# Patient Record
Sex: Male | Born: 1950 | Race: White | Hispanic: No | Marital: Married | Smoking: Current every day smoker
Health system: Southern US, Community
[De-identification: ages and names within clinical notes are randomized; demographics above are authoritative.]

## PROBLEM LIST (undated history)

## (undated) DIAGNOSIS — R519 Headache, unspecified: Secondary | ICD-10-CM

## (undated) DIAGNOSIS — R51 Headache: Secondary | ICD-10-CM

## (undated) HISTORY — PX: FINGER SURGERY: SHX640

## (undated) HISTORY — PX: APPENDECTOMY: SHX54

---

## 2004-11-23 ENCOUNTER — Emergency Department: Payer: Self-pay | Admitting: Emergency Medicine

## 2004-11-23 ENCOUNTER — Other Ambulatory Visit: Payer: Self-pay

## 2004-11-25 ENCOUNTER — Ambulatory Visit: Payer: Self-pay | Admitting: Emergency Medicine

## 2006-01-20 ENCOUNTER — Ambulatory Visit: Payer: Self-pay | Admitting: Family Medicine

## 2006-12-13 ENCOUNTER — Emergency Department: Payer: Self-pay | Admitting: Internal Medicine

## 2007-11-11 ENCOUNTER — Emergency Department: Payer: Self-pay | Admitting: Emergency Medicine

## 2007-11-11 ENCOUNTER — Other Ambulatory Visit: Payer: Self-pay

## 2008-10-01 ENCOUNTER — Emergency Department: Payer: Self-pay | Admitting: Emergency Medicine

## 2015-11-22 ENCOUNTER — Encounter
Admission: RE | Admit: 2015-11-22 | Discharge: 2015-11-22 | Disposition: A | Discharge status: Home / Self Care | Payer: Medicare Other | Source: Ambulatory Visit | Attending: Surgery | Admitting: Surgery

## 2015-11-22 DIAGNOSIS — Z01810 Encounter for preprocedural cardiovascular examination: Secondary | ICD-10-CM | POA: Diagnosis not present

## 2015-11-22 HISTORY — DX: Headache: R51

## 2015-11-22 HISTORY — DX: Headache, unspecified: R51.9

## 2015-11-22 NOTE — Patient Instructions (Signed)
  Your procedure is scheduled on: November 28, 2015 (Thursday) Report to Same Day Surgery 2nd floor [facility] To find out your arrival time please call [phone] between 1PM - 3PM on November 27, 2015 (Wednesday)  Remember: Instructions that are not followed completely may result in serious medical risk, up to and including death, or upon the discretion of your surgeon and anesthesiologist your surgery may need to be rescheduled.    _x___ 1. Do not eat food or drink liquids after midnight. No gum chewing or hard candies.     _x__ 2. No Alcohol for 24 hours before or after surgery.   _x___3. No Smoking for 24 prior to surgery.   ____  4. Bring all medications with you on the day of surgery if instructed.    __x__ 5. Notify your doctor if there is any change in your medical condition     (cold, fever, infections).     Do not wear jewelry, make-up, hairpins, clips or nail polish.  Do not wear lotions, powders, or perfumes. You may wear deodorant.  Do not shave 48 hours prior to surgery. Men may shave face and neck.  Do not bring valuables to the hospital.    [facility] is not responsible for any belongings or valuables.               Contacts, dentures or bridgework may not be worn into surgery.  Leave your suitcase in the car. After surgery it may be brought to your room.  For patients admitted to the hospital, discharge time is determined by your treatment team.   Patients discharged the day of surgery will not be allowed to drive home.    Please read over the following fact sheets that you were given:   [facility] Preparing for [facility] and or MRSA Information   _x___ Take these medicines the morning of surgery with A SIP OF WATER:    1.   2.  3.  4.  5.  6.  ____ Fleet Enema (as directed)   _x___ Use CHG Soap or sage wipes as directed on instruction sheet   ____ Use inhalers on the day of surgery and bring to hospital day of surgery  ____ Stop metformin 2 days  prior to surgery    ____ Take 1/2 of usual insulin dose the night before surgery and none on the morning of  surgery          _x___ Stop aspirin or coumadin, or plavix (NO ASPIRIN)  _x__ Stop Anti-inflammatories such as Advil, Aleve, Ibuprofen, Motrin, Naproxen,          Naprosyn, Goodies powders or aspirin products. Ok to take Tylenol.   ____ Stop supplements until after surgery.    ____ Bring C-Pap to the hospital.

## 2015-11-28 ENCOUNTER — Ambulatory Visit
Admission: RE | Admit: 2015-11-28 | Discharge: 2015-11-28 | Disposition: A | Discharge status: Home / Self Care | Payer: Medicare Other | Source: Ambulatory Visit | Attending: Surgery | Admitting: Surgery

## 2015-11-28 ENCOUNTER — Encounter
Admission: RE | Disposition: A | Discharge status: Home / Self Care | Payer: Self-pay | Source: Ambulatory Visit | Attending: Surgery

## 2015-11-28 ENCOUNTER — Encounter: Payer: Self-pay | Admitting: *Deleted

## 2015-11-28 ENCOUNTER — Ambulatory Visit: Payer: Medicare Other | Admitting: *Deleted

## 2015-11-28 DIAGNOSIS — R51 Headache: Secondary | ICD-10-CM | POA: Insufficient documentation

## 2015-11-28 DIAGNOSIS — Z833 Family history of diabetes mellitus: Secondary | ICD-10-CM | POA: Diagnosis not present

## 2015-11-28 DIAGNOSIS — F17210 Nicotine dependence, cigarettes, uncomplicated: Secondary | ICD-10-CM | POA: Diagnosis not present

## 2015-11-28 DIAGNOSIS — Z9852 Vasectomy status: Secondary | ICD-10-CM | POA: Diagnosis not present

## 2015-11-28 DIAGNOSIS — Z808 Family history of malignant neoplasm of other organs or systems: Secondary | ICD-10-CM | POA: Insufficient documentation

## 2015-11-28 DIAGNOSIS — Z885 Allergy status to narcotic agent status: Secondary | ICD-10-CM | POA: Insufficient documentation

## 2015-11-28 DIAGNOSIS — Z803 Family history of malignant neoplasm of breast: Secondary | ICD-10-CM | POA: Diagnosis not present

## 2015-11-28 DIAGNOSIS — K429 Umbilical hernia without obstruction or gangrene: Secondary | ICD-10-CM | POA: Diagnosis present

## 2015-11-28 HISTORY — PX: UMBILICAL HERNIA REPAIR: SHX196

## 2015-11-28 SURGERY — REPAIR, HERNIA, UMBILICAL, ADULT
Anesthesia: General | Wound class: Clean

## 2015-11-28 MED ORDER — LACTATED RINGERS IV SOLN
INTRAVENOUS | Status: DC
  Administered 2015-11-28 (×2): via INTRAVENOUS

## 2015-11-28 MED ORDER — FENTANYL CITRATE (PF) 100 MCG/2ML IJ SOLN
INTRAMUSCULAR | Status: DC | PRN
  Administered 2015-11-28: 100 ug via INTRAVENOUS

## 2015-11-28 MED ORDER — MIDAZOLAM HCL 2 MG/2ML IJ SOLN
INTRAMUSCULAR | Status: DC | PRN
Start: 2015-11-28 — End: 2015-11-28
  Administered 2015-11-28: 2 mg via INTRAVENOUS

## 2015-11-28 MED ORDER — HYDROCODONE-ACETAMINOPHEN 5-325 MG PO TABS: 1.0000 | ORAL_TABLET | ORAL | Status: DC | PRN

## 2015-11-28 MED ORDER — FAMOTIDINE 20 MG PO TABS
20.0000 mg | ORAL_TABLET | Freq: Once | ORAL | Status: AC
  Administered 2015-11-28: 20 mg via ORAL

## 2015-11-28 MED ORDER — ROCURONIUM BROMIDE 100 MG/10ML IV SOLN
INTRAVENOUS | Status: DC | PRN
  Administered 2015-11-28: 5 mg via INTRAVENOUS
  Administered 2015-11-28: 20 mg via INTRAVENOUS

## 2015-11-28 MED ORDER — HYDROCODONE-ACETAMINOPHEN 5-325 MG PO TABS: 1.0000 | ORAL_TABLET | ORAL | 0 refills | Status: DC | PRN

## 2015-11-28 MED ORDER — FENTANYL CITRATE (PF) 100 MCG/2ML IJ SOLN
INTRAMUSCULAR | Status: AC
  Filled 2015-11-28: qty 2

## 2015-11-28 MED ORDER — SUCCINYLCHOLINE CHLORIDE 20 MG/ML IJ SOLN
INTRAMUSCULAR | Status: DC | PRN
  Administered 2015-11-28: 140 mg via INTRAVENOUS

## 2015-11-28 MED ORDER — CEFAZOLIN SODIUM-DEXTROSE 2-4 GM/100ML-% IV SOLN
INTRAVENOUS | Status: AC
  Administered 2015-11-28: 2 g via INTRAVENOUS
  Filled 2015-11-28: qty 100

## 2015-11-28 MED ORDER — ONDANSETRON HCL 4 MG/2ML IJ SOLN
INTRAMUSCULAR | Status: DC | PRN
  Administered 2015-11-28: 4 mg via INTRAVENOUS

## 2015-11-28 MED ORDER — CEFAZOLIN SODIUM-DEXTROSE 2-4 GM/100ML-% IV SOLN
2.0000 g | Freq: Once | INTRAVENOUS | Status: AC
Start: 2015-11-28 — End: 2015-11-28
  Administered 2015-11-28: 2 g via INTRAVENOUS

## 2015-11-28 MED ORDER — PROPOFOL 10 MG/ML IV BOLUS
INTRAVENOUS | Status: DC | PRN
  Administered 2015-11-28: 200 mg via INTRAVENOUS

## 2015-11-28 MED ORDER — LABETALOL HCL 5 MG/ML IV SOLN
INTRAVENOUS | Status: DC | PRN
  Administered 2015-11-28 (×2): 10 mg via INTRAVENOUS

## 2015-11-28 MED ORDER — IPRATROPIUM-ALBUTEROL 0.5-2.5 (3) MG/3ML IN SOLN
RESPIRATORY_TRACT | Status: AC
  Administered 2015-11-28: 3 mL
  Filled 2015-11-28: qty 3

## 2015-11-28 MED ORDER — IPRATROPIUM-ALBUTEROL 0.5-2.5 (3) MG/3ML IN SOLN
3.0000 mL | Freq: Once | RESPIRATORY_TRACT | Status: AC
  Administered 2015-11-28: 3 mL via RESPIRATORY_TRACT

## 2015-11-28 MED ORDER — FAMOTIDINE 20 MG PO TABS
ORAL_TABLET | ORAL | Status: AC
  Administered 2015-11-28: 20 mg via ORAL
  Filled 2015-11-28: qty 1

## 2015-11-28 MED ORDER — SUGAMMADEX SODIUM 200 MG/2ML IV SOLN
INTRAVENOUS | Status: DC | PRN
  Administered 2015-11-28: 190.6 mg via INTRAVENOUS

## 2015-11-28 MED ORDER — BUPIVACAINE-EPINEPHRINE 0.5% -1:200000 IJ SOLN
INTRAMUSCULAR | Status: DC | PRN
  Administered 2015-11-28: 7 mL

## 2015-11-28 MED ORDER — BUPIVACAINE-EPINEPHRINE (PF) 0.5% -1:200000 IJ SOLN
INTRAMUSCULAR | Status: AC
  Filled 2015-11-28: qty 30

## 2015-11-28 SURGICAL SUPPLY — 25 items
BLADE CLIPPER SURG (BLADE) ×3 IMPLANT
BLADE SURG 15 STRL LF DISP TIS (BLADE) ×1 IMPLANT
BLADE SURG 15 STRL SS (BLADE) ×2
CANISTER SUCT 1200ML W/VALVE (MISCELLANEOUS) ×3 IMPLANT
CHLORAPREP W/TINT 26ML (MISCELLANEOUS) ×3 IMPLANT
DRAPE LAPAROTOMY 77X122 PED (DRAPES) ×3 IMPLANT
ELECT REM PT RETURN 9FT ADLT (ELECTROSURGICAL) ×3
ELECTRODE REM PT RTRN 9FT ADLT (ELECTROSURGICAL) ×1 IMPLANT
GLOVE BIO SURGEON STRL SZ7.5 (GLOVE) ×12 IMPLANT
GOWN STRL REUS W/ TWL LRG LVL3 (GOWN DISPOSABLE) ×3 IMPLANT
GOWN STRL REUS W/TWL LRG LVL3 (GOWN DISPOSABLE) ×6
KIT RM TURNOVER STRD PROC AR (KITS) ×3 IMPLANT
LABEL OR SOLS (LABEL) ×3 IMPLANT
LIQUID BAND (GAUZE/BANDAGES/DRESSINGS) ×6 IMPLANT
MESH SYNTHETIC 4X6 SOFT BARD (Mesh General) ×1 IMPLANT
MESH SYNTHETIC SOFT BARD 4X6 (Mesh General) ×2 IMPLANT
NEEDLE HYPO 25X1 1.5 SAFETY (NEEDLE) ×3 IMPLANT
NS IRRIG 500ML POUR BTL (IV SOLUTION) ×3 IMPLANT
PACK BASIN MINOR ARMC (MISCELLANEOUS) ×3 IMPLANT
SUT CHROMIC 3 0 SH 27 (SUTURE) IMPLANT
SUT CHROMIC 4 0 RB 1X27 (SUTURE) ×3 IMPLANT
SUT MNCRL+ 5-0 UNDYED PC-3 (SUTURE) ×1 IMPLANT
SUT MONOCRYL 5-0 (SUTURE) ×2
SUT SURGILON 0 30 BLK (SUTURE) ×3 IMPLANT
SYRINGE 10CC LL (SYRINGE) ×3 IMPLANT

## 2015-11-28 NOTE — Discharge Instructions (Addendum)
AMBULATORY SURGERY  °DISCHARGE INSTRUCTIONS ° ° °1) The drugs that you were given will stay in your system until tomorrow so for the next 24 hours you should not: ° °A) Drive an automobile °B) Make any legal decisions °C) Drink any alcoholic beverage ° ° °2) You may resume regular meals tomorrow.  Today it is better to start with liquids and gradually work up to solid foods. ° °You may eat anything you prefer, but it is better to start with liquids, then soup and crackers, and gradually work up to solid foods. ° ° °3) Please notify your doctor immediately if you have any unusual bleeding, trouble breathing, redness and pain at the surgery site, drainage, fever, or pain not relieved by medication. ° ° °4) Additional Instructions: °Take Tylenol or Norco if needed for pain. ° °Should not drive or do anything dangerous when taking Norco. ° °May shower. ° °Avoid straining and heavy lifting. °

## 2015-11-28 NOTE — Anesthesia Preprocedure Evaluation (Addendum)
Anesthesia Evaluation  Patient identified by MRN, date of birth, ID band Patient awake    Reviewed: Allergy & Precautions, NPO status , Patient's Chart, lab work & pertinent test results  Airway Mallampati: II  TM Distance: >3 FB     Dental  (+) Chipped   Pulmonary Current Smoker,    Pulmonary exam normal        Cardiovascular Normal cardiovascular exam     Neuro/Psych  Headaches, negative psych ROS   GI/Hepatic negative GI ROS, Neg liver ROS,   Endo/Other  negative endocrine ROS  Renal/GU negative Renal ROS  negative genitourinary   Musculoskeletal negative musculoskeletal ROS (+)   Abdominal Normal abdominal exam  (+)   Peds negative pediatric ROS (+)  Hematology negative hematology ROS (+)   Anesthesia Other Findings   Reproductive/Obstetrics                             Anesthesia Physical Anesthesia Plan  ASA: II  Anesthesia Plan: General   Post-op Pain Management:    Induction: Intravenous  Airway Management Planned: Oral ETT  Additional Equipment:   Intra-op Plan:   Post-operative Plan: Extubation in OR  Informed Consent: I have reviewed the patients History and Physical, chart, labs and discussed the procedure including the risks, benefits and alternatives for the proposed anesthesia with the patient or authorized representative who has indicated his/her understanding and acceptance.   Dental advisory given  Plan Discussed with: CRNA and Surgeon  Anesthesia Plan Comments:         Anesthesia Quick Evaluation

## 2015-11-28 NOTE — Transfer of Care (Signed)
Immediate Anesthesia Transfer of Care Note  Patient: Shawn Mckay  Procedure(s) Performed: Procedure(s): HERNIA REPAIR UMBILICAL ADULT (N/A)  Patient Location: PACU  Anesthesia Type:General  Level of Consciousness: awake, alert  and oriented  Airway & Oxygen Therapy: Patient Spontanous Breathing and Patient connected to face mask oxygen  Post-op Assessment: Report given to RN and Post -op Vital signs reviewed and stable  Post vital signs: Reviewed and stable   Last Vitals:  Vitals:   11/28/15 0810  BP: 117/75  Pulse: 87  Resp: 18  Temp: 36.8 C    Last Pain:  Vitals:   11/28/15 0810  TempSrc: Oral         Complications: No apparent anesthesia complications

## 2015-11-28 NOTE — H&P (Signed)
  He reports no change in condition since the day of the office examination.  Lab work reviewed.  The hernia was examined and was found to be reduced.  I discussed the plan for umbilical hernia repair

## 2015-11-28 NOTE — Anesthesia Procedure Notes (Signed)
Procedure Name: Intubation Date/Time: 11/28/2015 9:01 AM Performed by: Edyth GunnelsGILBERT, Louis Gaw Pre-anesthesia Checklist: Patient identified, Emergency Drugs available, Suction available, Patient being monitored and Timeout performed Patient Re-evaluated:Patient Re-evaluated prior to inductionOxygen Delivery Method: Circle system utilized Preoxygenation: Pre-oxygenation with 100% oxygen Intubation Type: IV induction Ventilation: Mask ventilation without difficulty Laryngoscope Size: Mac and 4 Grade View: Grade II Tube type: Oral Tube size: 7.5 mm Number of attempts: 1 Airway Equipment and Method: Stylet Secured at: 24 cm Tube secured with: Tape Dental Injury: Teeth and Oropharynx as per pre-operative assessment

## 2015-11-28 NOTE — OR Nursing (Signed)
At 1134 Dr. Katrinka BlazingSmith into see pt.  OK to dc home.

## 2015-11-28 NOTE — Anesthesia Postprocedure Evaluation (Signed)
Anesthesia Post Note  Patient: Shawn Mckay  Procedure(s) Performed: Procedure(s) (LRB): HERNIA REPAIR UMBILICAL ADULT (N/A)  Patient location during evaluation: PACU Anesthesia Type: General Level of consciousness: awake and alert and oriented Pain management: pain level controlled Vital Signs Assessment: post-procedure vital signs reviewed and stable Respiratory status: spontaneous breathing Cardiovascular status: blood pressure returned to baseline Anesthetic complications: no    Last Vitals:  Vitals:   11/28/15 1105 11/28/15 1141  BP: 117/63 116/73  Pulse: 73 71  Resp: 20 20  Temp: 36.6 C     Last Pain:  Vitals:   11/28/15 1141  TempSrc:   PainSc: 1                  Danaka Llera

## 2015-11-28 NOTE — Op Note (Signed)
OPERATIVE REPORT  PREOPERATIVE  DIAGNOSIS: . Umbilical hernia  POSTOPERATIVE DIAGNOSIS: . Umbilical hernia  PROCEDURE: . Umbilical hernia repair  ANESTHESIA:  General  SURGEON: Renda RollsWilton Smith  MD   INDICATIONS: . He has a history of episodes of localized pain and bulging at the umbilicus with associated nausea. Each time he has been able to manually reduce the hernia to gain relief. An umbilical hernia was demonstrated on physical exam and repair was recommended for definitive treatment.  With the patient on the operating table in the supine position the periumbilical skin was clipped and the abdomen was prepared with ChloraPrep solution and draped in a sterile manner. An infraumbilical curvilinear incision was made and carried down through subcutaneous tissues to encounter an umbilical hernia sac. The sac was dissected free from surrounding tissues and away from the fascial ring defect and was inverted. Bard soft mesh was cut to create an oval shape of 2 x 3 cm. This was placed into the properitoneal plane and sutured to the overlying fascia with through and through 0 Surgilon sutures. The fascial defect was then repaired with a transversely oriented suture line of interrupted 0 Surgilon figure-of-eight sutures incorporating each suture into the mesh. The subcutaneous tissues were infiltrated with half percent Sensorcaine with epinephrine. The skin of the umbilicus was sutured to the deep fascia with 5-0 Monocryl. The skin was closed with a running 5-0 Monocryl subcuticular suture and LiquiBand. The patient tolerated surgery satisfactorily and was prepared for transfer to the recovery room  [facility] Smith M.D.

## 2015-11-28 NOTE — Anesthesia Procedure Notes (Signed)
Procedure Name: Intubation Date/Time: 11/28/2015 9:15 AM Performed by: Junious SilkNOLES, Tavaria Mackins Pre-anesthesia Checklist: Patient identified, Patient being monitored, Timeout performed, Emergency Drugs available and Suction available Patient Re-evaluated:Patient Re-evaluated prior to inductionOxygen Delivery Method: Circle system utilized Preoxygenation: Pre-oxygenation with 100% oxygen Intubation Type: IV induction Ventilation: Mask ventilation without difficulty Laryngoscope Size: Mac and 3 Grade View: Grade I Tube type: Oral Tube size: 7.5 mm Number of attempts: 1 Airway Equipment and Method: Stylet Placement Confirmation: ETT inserted through vocal cords under direct vision,  positive ETCO2 and breath sounds checked- equal and bilateral Secured at: 21 cm Tube secured with: Tape Dental Injury: Teeth and Oropharynx as per pre-operative assessment

## 2015-11-29 ENCOUNTER — Encounter: Payer: Self-pay | Admitting: Surgery

## 2016-01-01 MED ORDER — BUPIVACAINE-EPINEPHRINE (PF) 0.25% -1:200000 IJ SOLN
INTRAMUSCULAR | Status: AC
  Filled 2016-01-01: qty 30

## 2016-01-02 ENCOUNTER — Encounter
Admission: RE | Admit: 2016-01-02 | Discharge: 2016-01-02 | Disposition: A | Discharge status: Home / Self Care | Payer: Medicare Other | Source: Ambulatory Visit | Attending: Surgery | Admitting: Surgery

## 2016-01-02 NOTE — Patient Instructions (Signed)
  Your procedure is scheduled on: 01-09-16 (THURSDAY) Report to Same Day Surgery 2nd floor medical mall To find out your arrival time please call [phone] between 1PM - 3PM on 01-08-16 [facility](WEDNESDAY)  Remember: Instructions that are not followed completely may result in serious medical risk, up to and including death, or upon the discretion of your surgeon and anesthesiologist your surgery may need to be rescheduled.    _x___ 1. Do not eat food or drink liquids after midnight. No gum chewing or hard candies.     __x__ 2. No Alcohol for 24 hours before or after surgery.   __x__3. No Smoking for 24 prior to surgery.   ____  4. Bring all medications with you on the day of surgery if instructed.    __x__ 5. Notify your doctor if there is any change in your medical condition     (cold, fever, infections).     Do not wear jewelry, make-up, hairpins, clips or nail polish.  Do not wear lotions, powders, or perfumes. You may wear deodorant.  Do not shave 48 hours prior to surgery. Men may shave face and neck.  Do not bring valuables to the hospital.    [facility] is not responsible for any belongings or valuables.               Contacts, dentures or bridgework may not be worn into surgery.  Leave your suitcase in the car. After surgery it may be brought to your room.  For patients admitted to the hospital, discharge time is determined by your treatment team.   Patients discharged the day of surgery will not be allowed to drive home.    Please read over the following fact sheets that you were given:   [facility] Pre[facility] for [facility] and or MRSA Information   ____ Take these medicines the morning of surgery with A SIP OF WATER:    1. NONE  2.  3.  4.  5.  6.  ____Fleets enema or Magnesium Citrate as directed.   ____ Use CHG Soap or sage wipes as directed on instruction sheet   ____ Use inhalers on the day of surgery and bring to hospital day of surgery  ____ Stop metformin 2  days prior to surgery    ____ Take 1/2 of usual insulin dose the night before surgery and none on the morning of  surgery.   ____ Stop aspirin or coumadin, or plavix  x__ Stop Anti-inflammatories such as Advil, Aleve, Ibuprofen, Motrin, Naproxen,          Naprosyn, Goodies powders or aspirin products. Ok to take Tylenol.   ____ Stop supplements until after surgery.    ____ Bring C-Pap to the hospital.

## 2016-01-08 ENCOUNTER — Encounter: Payer: Self-pay | Admitting: *Deleted

## 2016-01-09 ENCOUNTER — Ambulatory Visit
Admission: RE | Admit: 2016-01-09 | Discharge: 2016-01-09 | Disposition: A | Discharge status: Home / Self Care | Payer: Medicare Other | Source: Ambulatory Visit | Attending: Surgery | Admitting: Surgery

## 2016-01-09 ENCOUNTER — Ambulatory Visit: Payer: Medicare Other | Admitting: Anesthesiology

## 2016-01-09 ENCOUNTER — Encounter: Payer: Self-pay | Admitting: *Deleted

## 2016-01-09 ENCOUNTER — Encounter
Admission: RE | Disposition: A | Discharge status: Home / Self Care | Payer: Self-pay | Source: Ambulatory Visit | Attending: Surgery

## 2016-01-09 DIAGNOSIS — Z833 Family history of diabetes mellitus: Secondary | ICD-10-CM | POA: Insufficient documentation

## 2016-01-09 DIAGNOSIS — Z9852 Vasectomy status: Secondary | ICD-10-CM | POA: Insufficient documentation

## 2016-01-09 DIAGNOSIS — K439 Ventral hernia without obstruction or gangrene: Secondary | ICD-10-CM | POA: Diagnosis present

## 2016-01-09 DIAGNOSIS — Z885 Allergy status to narcotic agent status: Secondary | ICD-10-CM | POA: Diagnosis not present

## 2016-01-09 DIAGNOSIS — Z9889 Other specified postprocedural states: Secondary | ICD-10-CM | POA: Diagnosis not present

## 2016-01-09 DIAGNOSIS — Z89022 Acquired absence of left finger(s): Secondary | ICD-10-CM | POA: Insufficient documentation

## 2016-01-09 DIAGNOSIS — F17210 Nicotine dependence, cigarettes, uncomplicated: Secondary | ICD-10-CM | POA: Diagnosis not present

## 2016-01-09 DIAGNOSIS — K436 Other and unspecified ventral hernia with obstruction, without gangrene: Secondary | ICD-10-CM | POA: Diagnosis not present

## 2016-01-09 DIAGNOSIS — Z803 Family history of malignant neoplasm of breast: Secondary | ICD-10-CM | POA: Insufficient documentation

## 2016-01-09 DIAGNOSIS — J449 Chronic obstructive pulmonary disease, unspecified: Secondary | ICD-10-CM | POA: Insufficient documentation

## 2016-01-09 DIAGNOSIS — Z9109 Other allergy status, other than to drugs and biological substances: Secondary | ICD-10-CM | POA: Diagnosis not present

## 2016-01-09 HISTORY — PX: VENTRAL HERNIA REPAIR: SHX424

## 2016-01-09 SURGERY — REPAIR, HERNIA, VENTRAL
Anesthesia: General

## 2016-01-09 MED ORDER — FAMOTIDINE 20 MG PO TABS
20.0000 mg | ORAL_TABLET | Freq: Once | ORAL | Status: AC
  Administered 2016-01-09: 20 mg via ORAL

## 2016-01-09 MED ORDER — KETOROLAC TROMETHAMINE 30 MG/ML IJ SOLN
INTRAMUSCULAR | Status: DC | PRN
  Administered 2016-01-09: 30 mg via INTRAVENOUS

## 2016-01-09 MED ORDER — LIDOCAINE HCL 2 % EX GEL
CUTANEOUS | Status: DC | PRN
  Administered 2016-01-09: 1 via TOPICAL

## 2016-01-09 MED ORDER — BUPIVACAINE-EPINEPHRINE (PF) 0.5% -1:200000 IJ SOLN
INTRAMUSCULAR | Status: AC
  Filled 2016-01-09: qty 30

## 2016-01-09 MED ORDER — HYDROCODONE-ACETAMINOPHEN 5-325 MG PO TABS: 1.0000 | ORAL_TABLET | ORAL | Status: DC | PRN

## 2016-01-09 MED ORDER — SUGAMMADEX SODIUM 200 MG/2ML IV SOLN
INTRAVENOUS | Status: DC | PRN
  Administered 2016-01-09: 200 mg via INTRAVENOUS

## 2016-01-09 MED ORDER — IPRATROPIUM-ALBUTEROL 0.5-2.5 (3) MG/3ML IN SOLN
RESPIRATORY_TRACT | Status: AC
  Filled 2016-01-09: qty 3

## 2016-01-09 MED ORDER — DEXAMETHASONE SODIUM PHOSPHATE 10 MG/ML IJ SOLN
INTRAMUSCULAR | Status: DC | PRN
  Administered 2016-01-09: 5 mg via INTRAVENOUS

## 2016-01-09 MED ORDER — OXYCODONE HCL 5 MG PO TABS: 5.0000 mg | ORAL_TABLET | Freq: Once | ORAL | Status: DC | PRN

## 2016-01-09 MED ORDER — HYDROCODONE-ACETAMINOPHEN 5-325 MG PO TABS: 1.0000 | ORAL_TABLET | ORAL | 0 refills | Status: DC | PRN

## 2016-01-09 MED ORDER — ROCURONIUM BROMIDE 100 MG/10ML IV SOLN
INTRAVENOUS | Status: DC | PRN
  Administered 2016-01-09: 10 mg via INTRAVENOUS
  Administered 2016-01-09: 40 mg via INTRAVENOUS

## 2016-01-09 MED ORDER — 0.9 % SODIUM CHLORIDE (POUR BTL) OPTIME
TOPICAL | Status: DC | PRN
  Administered 2016-01-09: 500 mL

## 2016-01-09 MED ORDER — CEFAZOLIN SODIUM-DEXTROSE 2-4 GM/100ML-% IV SOLN
2.0000 g | Freq: Once | INTRAVENOUS | Status: AC
  Administered 2016-01-09: 2 g via INTRAVENOUS

## 2016-01-09 MED ORDER — IPRATROPIUM-ALBUTEROL 0.5-2.5 (3) MG/3ML IN SOLN
3.0000 mL | Freq: Once | RESPIRATORY_TRACT | Status: AC
  Administered 2016-01-09: 3 mL via RESPIRATORY_TRACT

## 2016-01-09 MED ORDER — FENTANYL CITRATE (PF) 100 MCG/2ML IJ SOLN
INTRAMUSCULAR | Status: DC | PRN
  Administered 2016-01-09: 100 ug via INTRAVENOUS
  Administered 2016-01-09 (×2): 50 ug via INTRAVENOUS

## 2016-01-09 MED ORDER — IPRATROPIUM-ALBUTEROL 0.5-2.5 (3) MG/3ML IN SOLN
RESPIRATORY_TRACT | Status: AC
  Administered 2016-01-09: 3 mL via RESPIRATORY_TRACT
  Filled 2016-01-09: qty 3

## 2016-01-09 MED ORDER — LACTATED RINGERS IV SOLN
INTRAVENOUS | Status: DC
  Administered 2016-01-09: 09:00:00 via INTRAVENOUS

## 2016-01-09 MED ORDER — ONDANSETRON HCL 4 MG/2ML IJ SOLN
INTRAMUSCULAR | Status: DC | PRN
  Administered 2016-01-09: 4 mg via INTRAVENOUS

## 2016-01-09 MED ORDER — BUPIVACAINE-EPINEPHRINE 0.5% -1:200000 IJ SOLN
INTRAMUSCULAR | Status: DC | PRN
  Administered 2016-01-09: 9 mL

## 2016-01-09 MED ORDER — GLYCOPYRROLATE 0.2 MG/ML IJ SOLN
INTRAMUSCULAR | Status: DC | PRN
  Administered 2016-01-09: 0.2 mg via INTRAVENOUS

## 2016-01-09 MED ORDER — LIDOCAINE HCL (CARDIAC) 20 MG/ML IV SOLN
INTRAVENOUS | Status: DC | PRN
  Administered 2016-01-09: 100 mg via INTRAVENOUS

## 2016-01-09 MED ORDER — CEFAZOLIN SODIUM-DEXTROSE 2-4 GM/100ML-% IV SOLN
INTRAVENOUS | Status: AC
  Filled 2016-01-09: qty 100

## 2016-01-09 MED ORDER — FAMOTIDINE 20 MG PO TABS
ORAL_TABLET | ORAL | Status: AC
  Filled 2016-01-09: qty 1

## 2016-01-09 MED ORDER — OXYCODONE HCL 5 MG/5ML PO SOLN: 5.0000 mg | Freq: Once | ORAL | Status: DC | PRN

## 2016-01-09 MED ORDER — MIDAZOLAM HCL 2 MG/2ML IJ SOLN
INTRAMUSCULAR | Status: DC | PRN
  Administered 2016-01-09: 2 mg via INTRAVENOUS

## 2016-01-09 MED ORDER — FENTANYL CITRATE (PF) 100 MCG/2ML IJ SOLN: 25.0000 ug | INTRAMUSCULAR | Status: DC | PRN

## 2016-01-09 MED ORDER — PROPOFOL 10 MG/ML IV BOLUS
INTRAVENOUS | Status: DC | PRN
  Administered 2016-01-09: 200 mg via INTRAVENOUS

## 2016-01-09 SURGICAL SUPPLY — 30 items
CANISTER SUCT 1200ML W/VALVE (MISCELLANEOUS) ×3 IMPLANT
CHLORAPREP W/TINT 26ML (MISCELLANEOUS) ×3 IMPLANT
DERMABOND ADVANCED (GAUZE/BANDAGES/DRESSINGS) ×2
DERMABOND ADVANCED .7 DNX12 (GAUZE/BANDAGES/DRESSINGS) ×1 IMPLANT
DRAPE LAPAROTOMY 100X77 ABD (DRAPES) ×3 IMPLANT
ELECT REM PT RETURN 9FT ADLT (ELECTROSURGICAL) ×6
ELECTRODE REM PT RTRN 9FT ADLT (ELECTROSURGICAL) ×2 IMPLANT
GAUZE SPONGE 4X4 12PLY STRL (GAUZE/BANDAGES/DRESSINGS) IMPLANT
GLOVE BIO SURGEON STRL SZ 6.5 (GLOVE) ×4 IMPLANT
GLOVE BIO SURGEON STRL SZ7.5 (GLOVE) ×3 IMPLANT
GLOVE BIO SURGEONS STRL SZ 6.5 (GLOVE) ×2
GLOVE ECLIPSE 7.0 STRL STRAW (GLOVE) ×6 IMPLANT
GLOVE INDICATOR 6.5 STRL GRN (GLOVE) ×6 IMPLANT
GOWN STRL REUS W/ TWL LRG LVL3 (GOWN DISPOSABLE) ×3 IMPLANT
GOWN STRL REUS W/TWL LRG LVL3 (GOWN DISPOSABLE) ×6
KIT RM TURNOVER STRD PROC AR (KITS) ×3 IMPLANT
LABEL OR SOLS (LABEL) ×3 IMPLANT
LIQUID BAND (GAUZE/BANDAGES/DRESSINGS) ×3 IMPLANT
MESH SYNTHETIC 4X6 SOFT BARD (Mesh General) ×1 IMPLANT
MESH SYNTHETIC SOFT BARD 4X6 (Mesh General) ×2 IMPLANT
NEEDLE HYPO 25X1 1.5 SAFETY (NEEDLE) ×3 IMPLANT
NS IRRIG 500ML POUR BTL (IV SOLUTION) ×3 IMPLANT
PACK BASIN MINOR ARMC (MISCELLANEOUS) ×3 IMPLANT
STAPLER SKIN PROX 35W (STAPLE) IMPLANT
SUT CHROMIC 3 0 SH 27 (SUTURE) ×3 IMPLANT
SUT MNCRL 4-0 (SUTURE) ×2
SUT MNCRL 4-0 27XMFL (SUTURE) ×1
SUT SURGILON 0 30 BLK (SUTURE) ×6 IMPLANT
SUTURE MNCRL 4-0 27XMF (SUTURE) ×1 IMPLANT
SYRINGE 10CC LL (SYRINGE) ×3 IMPLANT

## 2016-01-09 NOTE — H&P (Signed)
He comes in today for ventral hernia repair.  He reports no change condition since office visit   The hernia was demonstrated on exam today 3cm above umbilicus.  Discussed plan for surgery

## 2016-01-09 NOTE — Op Note (Signed)
OPERATIVE REPORT  PREOPERATIVE  DIAGNOSIS: . Ventral hernia  POSTOPERATIVE DIAGNOSIS: . Ventral hernia  PROCEDURE: . Ventral hernia repair  ANESTHESIA:  General  SURGEON: Renda RollsWilton Tanishia Lemaster  MD   INDICATIONS: . He had recent bulging to develop in the epigastrium with mild pain.   A ventral hernia was demonstrated on physical exam approximately 3 cm cephalad to the umbilicus. Repair was recommended for definitive treatment  With the patient on the operating table in the supine position the hair around the operative site was trimmed. The abdomen was prepared with ChloraPrep and draped in a sterile manner. A longitudinally oriented 3 cm incision was made in the epigastrium so that the lower end of the incision was approximately 1 cm above the umbilicus. Electrocautery was used for hemostasis. The dissection was carried down to encounter a ventral hernia which consisted of herniated properitoneal fat. A mass of tissue approximately 2 cm in dimension was dissected free from surrounding structures and dissected down to a fascial ring defect. The tissues were incarcerated. It was necessary to enlarge the hernia defect on the patient's left side to allow reduction of the hernia. The site was inspected and saw no other hernia in the immediate area. The properitoneal fat was dissected away from the fascia circumferentially. The fascia appeared to be thin consistent with diastasis recti. A decision was made to insert properitoneal mesh which was cut to create a circular shape of 2 cm in diameter. This was placed into the properitoneal plane and sutured to the overlying fascia with through and through 0 Surgilon sutures. Next the repair was carried out with a transversely oriented suture line of interrupted 0 Surgilon figure-of-eight sutures incorporating each suture into the mesh. The subcutaneous tissues were infiltrated with half percent Sensorcaine with epinephrine. The deep fascia was also infiltrated. The  subcutaneous tissues were closed with a 4-0 chromic pursestring suture. The skin was closed with running 4-0 Monocryl subcuticular suture and Dermabond. The patient tolerated the procedure well and was prepared for transfer to the recovery room  Shawn Mckay M.D.

## 2016-01-09 NOTE — Anesthesia Postprocedure Evaluation (Signed)
Anesthesia Post Note  Patient: Shawn Mckay  Procedure(s) Performed: Procedure(s) (LRB): HERNIA REPAIR VENTRAL ADULT (N/A)  Patient location during evaluation: PACU Anesthesia Type: General Level of consciousness: awake and alert Pain management: pain level controlled Vital Signs Assessment: post-procedure vital signs reviewed and stable Respiratory status: spontaneous breathing, nonlabored ventilation, respiratory function stable and patient connected to nasal cannula oxygen Cardiovascular status: blood pressure returned to baseline and stable Postop Assessment: no signs of nausea or vomiting Anesthetic complications: no    Last Vitals:  Vitals:   01/09/16 1045 01/09/16 1102  BP: 106/75 129/74  Pulse: 89 84  Resp: 14 16  Temp: 36.7 C 36.6 C    Last Pain:  Vitals:   01/09/16 1102  TempSrc: Temporal  PainSc: 0-No pain                 Cleda MccreedyJoseph K Cordell Guercio

## 2016-01-09 NOTE — Transfer of Care (Signed)
Immediate Anesthesia Transfer of Care Note  Patient: Shawn Mckay  Procedure(s) Performed: Procedure(s): HERNIA REPAIR VENTRAL ADULT (N/A)  Patient Location: PACU  Anesthesia Type:General  Level of Consciousness: sedated  Airway & Oxygen Therapy: Patient Spontanous Breathing and Patient connected to face mask oxygen  Post-op Assessment: Report given to RN and Post -op Vital signs reviewed and stable  Post vital signs: Reviewed and stable  Last Vitals:  Vitals:   01/09/16 1019 01/09/16 1027  BP: 132/75   Pulse: 96 98  Resp: (!) 25 (!) 26  Temp: 36.5 C     Complications: No apparent anesthesia complications

## 2016-01-09 NOTE — Discharge Instructions (Signed)
Take Tylenol or Norco if needed for pain. ° °Should not drive or do anything dangerous when taking Norco. ° °May shower and blot dry. ° °Avoid straining and heavy lifting. ° °AMBULATORY SURGERY  °DISCHARGE INSTRUCTIONS ° ° °1) The drugs that you were given will stay in your system until tomorrow so for the next 24 hours you should not: ° °A) Drive an automobile °B) Make any legal decisions °C) Drink any alcoholic beverage ° ° °2) You may resume regular meals tomorrow.  Today it is better to start with liquids and gradually work up to solid foods. ° °You may eat anything you prefer, but it is better to start with liquids, then soup and crackers, and gradually work up to solid foods. ° ° °3) Please notify your doctor immediately if you have any unusual bleeding, trouble breathing, redness and pain at the surgery site, drainage, fever, or pain not relieved by medication. ° °4) Additional Instructions: ° ° °Please contact your physician with any problems or Same Day Surgery at [phone], Monday through Friday 6 am to 4 pm, or [facility] number at [phone]. °

## 2016-01-09 NOTE — Anesthesia Procedure Notes (Signed)
Procedure Name: Intubation Date/Time: 01/09/2016 9:14 AM Performed by: Stormy FabianURTIS, Karrie Fluellen Pre-anesthesia Checklist: Patient identified, Patient being monitored, Timeout performed, Emergency Drugs available and Suction available Patient Re-evaluated:Patient Re-evaluated prior to inductionOxygen Delivery Method: Circle system utilized Preoxygenation: Pre-oxygenation with 100% oxygen Intubation Type: IV induction Ventilation: Mask ventilation without difficulty Laryngoscope Size: Mac and 3 Grade View: Grade I Tube type: Oral Tube size: 7.5 mm Number of attempts: 1 Airway Equipment and Method: Stylet Placement Confirmation: ETT inserted through vocal cords under direct vision,  positive ETCO2 and breath sounds checked- equal and bilateral Secured at: 25 (at teeth) cm Tube secured with: Tape Dental Injury: Teeth and Oropharynx as per pre-operative assessment

## 2016-01-09 NOTE — Anesthesia Preprocedure Evaluation (Signed)
Anesthesia Evaluation  Patient identified by MRN, date of birth, ID band Patient awake    Reviewed: Allergy & Precautions, H&P , NPO status , Patient's Chart, lab work & pertinent test results  History of Anesthesia Complications Negative for: history of anesthetic complications  Airway Mallampati: I  TM Distance: >3 FB Neck ROM: limited    Dental no notable dental hx. (+) Poor Dentition, Chipped   Pulmonary neg shortness of breath, COPD, Current Smoker,    Pulmonary exam normal breath sounds clear to auscultation       Cardiovascular Exercise Tolerance: Good (-) angina(-) Past MI and (-) DOE negative cardio ROS Normal cardiovascular exam Rhythm:regular Rate:Normal     Neuro/Psych  Headaches, negative psych ROS   GI/Hepatic negative GI ROS, Neg liver ROS, neg GERD  ,  Endo/Other  negative endocrine ROS  Renal/GU      Musculoskeletal   Abdominal   Peds  Hematology negative hematology ROS (+)   Anesthesia Other Findings Past Medical History: No date: Headache  Past Surgical History: No date: APPENDECTOMY No date: FINGER SURGERY Left     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 11/28/2015: UMBILICAL HERNIA REPAIR N/A     Comment: Procedure: HERNIA REPAIR UMBILICAL ADULT;                Surgeon: Nadeen LandauJarvis Wilton Smith, MD;  Location:               [facility] ORS;  Service: General;  Laterality: N/A;     Reproductive/Obstetrics negative OB ROS                             Anesthesia Physical Anesthesia Plan  ASA: III  Anesthesia Plan: General ETT   Post-op Pain Management:    Induction:   Airway Management Planned:   Additional Equipment:   Intra-op Plan:   Post-operative Plan:   Informed Consent: I have reviewed the patients History and Physical, chart, labs and discussed the procedure including the risks, benefits and alternatives for the proposed anesthesia with the  patient or authorized representative who has indicated his/her understanding and acceptance.     Plan Discussed with: Anesthesiologist, CRNA and Surgeon  Anesthesia Plan Comments:         Anesthesia Quick Evaluation

## 2016-08-01 ENCOUNTER — Emergency Department: Payer: Medicare Other

## 2016-08-01 ENCOUNTER — Emergency Department
Admission: EM | Admit: 2016-08-01 | Discharge: 2016-08-01 | Disposition: A | Discharge status: Home / Self Care | Payer: Medicare Other | Attending: Emergency Medicine | Admitting: Emergency Medicine

## 2016-08-01 ENCOUNTER — Encounter: Payer: Self-pay | Admitting: Emergency Medicine

## 2016-08-01 DIAGNOSIS — S91311A Laceration without foreign body, right foot, initial encounter: Secondary | ICD-10-CM | POA: Insufficient documentation

## 2016-08-01 DIAGNOSIS — F17210 Nicotine dependence, cigarettes, uncomplicated: Secondary | ICD-10-CM | POA: Insufficient documentation

## 2016-08-01 DIAGNOSIS — Y9301 Activity, walking, marching and hiking: Secondary | ICD-10-CM | POA: Diagnosis not present

## 2016-08-01 DIAGNOSIS — W268XXA Contact with other sharp object(s), not elsewhere classified, initial encounter: Secondary | ICD-10-CM | POA: Insufficient documentation

## 2016-08-01 DIAGNOSIS — S99921A Unspecified injury of right foot, initial encounter: Secondary | ICD-10-CM

## 2016-08-01 DIAGNOSIS — Z23 Encounter for immunization: Secondary | ICD-10-CM | POA: Insufficient documentation

## 2016-08-01 DIAGNOSIS — Z79899 Other long term (current) drug therapy: Secondary | ICD-10-CM | POA: Diagnosis not present

## 2016-08-01 DIAGNOSIS — Y929 Unspecified place or not applicable: Secondary | ICD-10-CM | POA: Diagnosis not present

## 2016-08-01 DIAGNOSIS — Y999 Unspecified external cause status: Secondary | ICD-10-CM | POA: Diagnosis not present

## 2016-08-01 MED ORDER — TETANUS-DIPHTH-ACELL PERTUSSIS 5-2.5-18.5 LF-MCG/0.5 IM SUSP
0.5000 mL | Freq: Once | INTRAMUSCULAR | Status: AC
  Administered 2016-08-01: 0.5 mL via INTRAMUSCULAR
  Filled 2016-08-01: qty 0.5

## 2016-08-01 NOTE — ED Provider Notes (Signed)
[facility] Emergency Department Provider Note  ____________________________________________  Time seen: Approximately 10:55 AM  I have reviewed the triage vital signs and the nursing notes.   HISTORY  Chief Complaint Laceration    HPI Shawn Mckay is a 66 y.o. male that presents emergency department with right foot pain after stepping on something sharp this morning. Patient states that he stepped on something sharp and when he looked down there was a pool of blood. He states he still has pain in the ball of his foot when walking. Foot is not painful if he is not bearing weight. He is not on any blood thinners. He is concerned that there is still something stuck in his foot. No additional injuries. Last tetanus shot was over 10 years ago. He has not taken anything for pain. He denies fever, shortness of breath, chest pain, nausea, vomiting, abdominal pain.    Past Medical History:  Diagnosis Date  . Headache     There are no active problems to display for this patient.   Past Surgical History:  Procedure Laterality Date  . APPENDECTOMY    . 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 UMBILICAL HERNIA REPAIR N/A 11/28/2015   Procedure: HERNIA REPAIR UMBILICAL ADULT;  Surgeon: Nadeen Landau, MD;  Location: [facility] ORS;  Service: General;  Laterality: N/A;  . VENTRAL HERNIA REPAIR N/A 01/09/2016   Procedure: HERNIA REPAIR VENTRAL ADULT;  Surgeon: Nadeen Landau, MD;  Location: [facility] ORS;  Service: General;  Laterality: N/A;    Prior to Admission medications   Medication Sig Start Date End Date Taking? Authorizing Provider  HYDROcodone-acetaminophen (NORCO) 5-325 MG tablet Take 1-2 tablets by mouth every 4 (four) hours as needed for moderate pain. 01/09/16   Nadeen Landau, MD    Allergies Bee venom and Codeine  History reviewed. No pertinent family history.  Social History Social History  Substance Use Topics   . Smoking status: Current Every Day Smoker    Packs/day: 2.00    Types: Cigarettes  . Smokeless tobacco: Never Used  . Alcohol use No     Review of Systems  Constitutional: No fever/chills Cardiovascular: No chest pain. Respiratory: No SOB. Gastrointestinal: No abdominal pain.  No nausea, no vomiting.  Musculoskeletal: Positive for foot pain. Skin: Negative for rash, ecchymosis. Neurological: Negative for headaches, numbness or tingling   ____________________________________________   PHYSICAL EXAM:  VITAL SIGNS: ED Triage Vitals  Enc Vitals Group     BP 08/01/16 0847 130/74     Pulse Rate 08/01/16 0847 81     Resp 08/01/16 0847 16     Temp 08/01/16 0847 98.3 F (36.8 C)     Temp Source 08/01/16 0847 Oral     SpO2 08/01/16 0847 97 %     Weight 08/01/16 0848 220 lb (99.8 kg)     Height 08/01/16 0848  (1.803 m)     Head Circumference --      Peak Flow --      Pain Score 08/01/16 0847 2     Pain Loc --      Pain Edu? --      Excl. in GC? --      Constitutional: Alert and oriented. Well appearing and in no acute distress. Eyes: Conjunctivae are normal. PERRL. EOMI. Head: Atraumatic. ENT:      Ears:      Nose: No congestion/rhinnorhea.      Mouth/Throat:  Mucous membranes are moist.  Neck: No stridor.   Cardiovascular: Normal rate, regular rhythm.  Good peripheral circulation. Respiratory: Normal respiratory effort without tachypnea or retractions. Lungs CTAB. Good air entry to the bases with no decreased or absent breath sounds. Musculoskeletal: Full range of motion to all extremities. No gross deformities appreciated. Neurologic:  Normal speech and language. No gross focal neurologic deficits are appreciated.  Skin:  Skin is warm, dry. No rash noted. 2 mm shallow laceration to ball of right foot. No bleeding. No foreign objects noted.   ____________________________________________   LABS (all labs ordered are listed, but only abnormal results are  displayed)  Labs Reviewed - No data to display ____________________________________________  EKG   ____________________________________________  RADIOLOGY I, AshLexine Batonersonally viewed and evaluated these images (plain radiographs) as part of my medical decision making, as well as reviewing the written report by the radiologist.  Dg Foot Complete Right  Result Date: 08/01/2016 CLINICAL DATA:  Patient possibly stepped on a piece of glass yesterday. Pain. Laceration. EXAM: RIGHT FOOT COMPLETE - 3+ VIEW COMPARISON:  None. FINDINGS: There is no evidence of fracture or dislocation. There is no evidence of arthropathy or other focal bone abnormality. Soft tissues are unremarkable. IMPRESSION: Negative.  No fracture or visible radiopaque foreign body. Electronically Signed   By: Elsie Stain M.D.   On: 08/01/2016 10:21    ____________________________________________    PROCEDURES  Procedure(s) performed:    Procedures  Laceration was cleaned with 500 mL normal saline. Wound was explored with 25-gauge needle.  Medications  Tdap (BOOSTRIX) injection 0.5 mL (not administered)     ____________________________________________   INITIAL IMPRESSION / ASSESSMENT AND PLAN / ED COURSE  Pertinent labs & imaging results that were available during my care of the patient were reviewed by me and considered in my medical decision making (see chart for details).  Review of the [place] CSRS was performed in accordance of the [facility] prior to dispensing any controlled drugs.  Patient's diagnosis is consistent with foot injury. Vital signs and exam are reassuring. No foreign objects noted on exploration or foot x-ray. Wound was irrigated with 500 mL of normal saline. Tetanus shot was updated. Patient is to follow up with PCP as directed. Patient is given ED precautions to return to the ED for any worsening or new symptoms.     ____________________________________________  FINAL CLINICAL  IMPRESSION(S) / ED DIAGNOSES  Final diagnoses:  Injury of right foot, initial encounter      NEW MEDICATIONS STARTED DURING THIS VISIT:  New Prescriptions   No medications on file        This chart was dictated using voice recognition software/Dragon. Despite best efforts to proofread, errors can occur which can change the meaning. Any change was purely unintentional.    Enid Derry, PA-C 08/01/16 1104    Jeanmarie Plant, MD 08/02/16 [phone]

## 2016-08-01 NOTE — ED Notes (Signed)
Patient has a wound the size of a pin head on his right foot.  Patient states, "I think there's a piece of glass or something stuck in it."  Patient is in no obvious distress at this time.

## 2016-08-01 NOTE — ED Triage Notes (Signed)
Pt states walking in laundry room and felt a sharp pain in the bottom of left foot when stepping down.  Pt states looking down seeing a puddle of blood.  Pt suspects it could be glass.

## 2017-05-11 ENCOUNTER — Other Ambulatory Visit: Payer: Self-pay | Admitting: General Surgery

## 2017-05-11 DIAGNOSIS — R1909 Other intra-abdominal and pelvic swelling, mass and lump: Secondary | ICD-10-CM

## 2017-05-17 ENCOUNTER — Ambulatory Visit
Admission: RE | Admit: 2017-05-17 | Discharge: 2017-05-17 | Disposition: A | Discharge status: Home / Self Care | Payer: Medicare Other | Source: Ambulatory Visit | Attending: General Surgery | Admitting: General Surgery

## 2017-05-17 DIAGNOSIS — R1909 Other intra-abdominal and pelvic swelling, mass and lump: Secondary | ICD-10-CM | POA: Diagnosis not present

## 2017-05-19 ENCOUNTER — Other Ambulatory Visit: Payer: Self-pay | Admitting: Orthopedic Surgery

## 2017-05-19 DIAGNOSIS — M25562 Pain in left knee: Principal | ICD-10-CM

## 2017-05-19 DIAGNOSIS — G8929 Other chronic pain: Secondary | ICD-10-CM

## 2017-05-28 ENCOUNTER — Ambulatory Visit
Admission: RE | Admit: 2017-05-28 | Discharge: 2017-05-28 | Disposition: A | Discharge status: Home / Self Care | Payer: Medicare Other | Source: Ambulatory Visit | Attending: Orthopedic Surgery | Admitting: Orthopedic Surgery

## 2017-05-28 DIAGNOSIS — G8929 Other chronic pain: Secondary | ICD-10-CM | POA: Diagnosis not present

## 2017-05-28 DIAGNOSIS — M1712 Unilateral primary osteoarthritis, left knee: Secondary | ICD-10-CM | POA: Diagnosis not present

## 2017-05-28 DIAGNOSIS — M25562 Pain in left knee: Secondary | ICD-10-CM | POA: Diagnosis present

## 2017-06-16 ENCOUNTER — Other Ambulatory Visit: Payer: Medicare Other

## 2017-06-23 ENCOUNTER — Encounter
Admission: RE | Admit: 2017-06-23 | Discharge: 2017-06-23 | Disposition: A | Discharge status: Home / Self Care | Payer: Medicare Other | Source: Ambulatory Visit | Attending: Orthopedic Surgery | Admitting: Orthopedic Surgery

## 2017-06-23 ENCOUNTER — Other Ambulatory Visit: Payer: Self-pay

## 2017-06-23 DIAGNOSIS — Z01810 Encounter for preprocedural cardiovascular examination: Secondary | ICD-10-CM | POA: Insufficient documentation

## 2017-06-23 DIAGNOSIS — F17200 Nicotine dependence, unspecified, uncomplicated: Secondary | ICD-10-CM | POA: Insufficient documentation

## 2017-06-23 DIAGNOSIS — Z01812 Encounter for preprocedural laboratory examination: Secondary | ICD-10-CM | POA: Diagnosis present

## 2017-06-23 DIAGNOSIS — R9431 Abnormal electrocardiogram [ECG] [EKG]: Secondary | ICD-10-CM | POA: Diagnosis not present

## 2017-06-23 LAB — CBC
HEMATOCRIT: 44.8 % (ref 40.0–52.0)
HEMOGLOBIN: 15 g/dL (ref 13.0–18.0)
MCH: 30.2 pg (ref 26.0–34.0)
MCHC: 33.4 g/dL (ref 32.0–36.0)
MCV: 90.2 fL (ref 80.0–100.0)
Platelets: 176 10*3/uL (ref 150–440)
RBC: 4.96 MIL/uL (ref 4.40–5.90)
RDW: 13.6 % (ref 11.5–14.5)
WBC: 8.4 10*3/uL (ref 3.8–10.6)

## 2017-06-23 LAB — BASIC METABOLIC PANEL
ANION GAP: 8 (ref 5–15)
BUN: 23 mg/dL — AB (ref 6–20)
CALCIUM: 9.9 mg/dL (ref 8.9–10.3)
CO2: 24 mmol/L (ref 22–32)
Chloride: 104 mmol/L (ref 101–111)
Creatinine, Ser: 0.94 mg/dL (ref 0.61–1.24)
GFR calc Af Amer: >60 mL/min (ref >60)
GLUCOSE: 138 mg/dL — AB (ref 65–99)
Potassium: 4 mmol/L (ref 3.5–5.1)
SODIUM: 136 mmol/L (ref 135–145)

## 2017-06-23 LAB — URINALYSIS, COMPLETE (UACMP) WITH MICROSCOPIC
Bilirubin Urine: NEGATIVE
Glucose, UA: NEGATIVE mg/dL
Hgb urine dipstick: NEGATIVE
Ketones, ur: NEGATIVE mg/dL
Leukocytes, UA: NEGATIVE
Nitrite: NEGATIVE
PROTEIN: NEGATIVE mg/dL
RBC / HPF: NONE SEEN RBC/hpf (ref 0–5)
SPECIFIC GRAVITY, URINE: 1.013 (ref 1.005–1.030)
pH: 5 (ref 5.0–8.0)

## 2017-06-23 LAB — SEDIMENTATION RATE: Sed Rate: 5 mm/hr (ref 0–20)

## 2017-06-23 LAB — TYPE AND SCREEN
ABO/RH(D): O POS
Antibody Screen: NEGATIVE

## 2017-06-23 LAB — APTT: aPTT: 29 seconds (ref 24–36)

## 2017-06-23 LAB — PROTIME-INR
INR: 0.91
PROTHROMBIN TIME: 12.2 s (ref 11.4–15.2)

## 2017-06-23 LAB — SURGICAL PCR SCREEN
MRSA, PCR: NEGATIVE
STAPHYLOCOCCUS AUREUS: NEGATIVE

## 2017-06-23 NOTE — Patient Instructions (Signed)
Your procedure is scheduled on: June 29, 2017 TUESDAY Report to Day Surgery on the 2nd floor of [facility]. To find out your arrival time, please call [phone] between 1PM - 3PM on: Monday June 28, 2017  REMEMBER: Instructions that are not followed completely may result in serious medical risk, up to and including death; or upon the discretion of your surgeon and anesthesiologist your surgery may need to be rescheduled.  Do not eat food after midnight the night before your procedure.  No gum chewing, lozengers or hard candies.  You may however, drink CLEAR liquids up to 2 hours before you are scheduled to arrive for your surgery. Do not drink anything within 2 hours of the start of your surgery.  Clear liquids include: - water  - apple juice without pulp - clear gatorade - black coffee or tea (Do NOT add anything to the coffee or tea) Do NOT drink anything that is not on this list.  Type 1 and Type 2 diabetics should only drink water.  No Alcohol for 24 hours before or after surgery.  No Smoking including e-cigarettes for 24 hours prior to surgery.  No chewable tobacco products for at least 6 hours prior to surgery.  No nicotine patches on the day of surgery.  On the morning of surgery brush your teeth with toothpaste and water, you may rinse your mouth with mouthwash if you wish. Do not swallow any toothpaste or mouthwash.  Notify your doctor if there is any change in your medical condition (cold, fever, infection).  Do not wear jewelry, make-up, hairpins, clips or nail polish.  Do not wear lotions, powders, or perfumes. You may NOT wear deodorant.  Do not shave 48 hours prior to surgery. Men may shave face and neck.  Contacts and dentures may not be worn into surgery.  Do not bring valuables to the hospital, including drivers license, insurance or credit cards.  [facility] is not responsible for any belongings or valuables.   TAKE THESE MEDICATIONS THE  MORNING OF SURGERY: NONE  Use CHG Soap or wipes as directed on instruction sheet.  Follow recommendations from Cardiologist, Pulmonologist or PCP regarding stopping Aspirin, Coumadin, Plavix, Eliquis, Pradaxa, or Pletal.  Stop Anti-inflammatories (NSAIDS) such as Advil, Aleve, Ibuprofen, Motrin, Naproxen, Naprosyn and Aspirin based products such as Excedrin, Goodys Powder, BC Powder. (May take Tylenol or Acetaminophen if needed.)  Stop ANY OVER THE COUNTER supplements until after surgery. (May continue Vitamin D, Vitamin B, and multivitamin.)  Wear comfortable clothing (specific to your surgery type) to the hospital.  Plan for stool softeners for home use.  If you are being admitted to the hospital overnight, leave your suitcase in the car. After surgery it may be brought to your room.  If you are being discharged the day of surgery, you will not be allowed to drive home. You will need a responsible adult to drive you home and stay with you that night.   If you are taking public transportation, you will need to have a responsible adult with you. Please confirm with your physician that it is acceptable to use public transportation.   Please call [phone] if you have any questions about these instructions.

## 2017-06-24 LAB — URINE CULTURE: Culture: NO GROWTH

## 2017-06-28 MED ORDER — CEFAZOLIN SODIUM-DEXTROSE 2-4 GM/100ML-% IV SOLN
2.0000 g | Freq: Once | INTRAVENOUS | Status: AC
  Administered 2017-06-29: 2 g via INTRAVENOUS

## 2017-06-28 MED ORDER — TRANEXAMIC ACID 1000 MG/10ML IV SOLN
1000.0000 mg | INTRAVENOUS | Status: AC
  Administered 2017-06-29: 1000 mg via INTRAVENOUS
  Filled 2017-06-28: qty 10

## 2017-06-29 ENCOUNTER — Inpatient Hospital Stay: Payer: Medicare Other

## 2017-06-29 ENCOUNTER — Inpatient Hospital Stay: Payer: Medicare Other | Admitting: Anesthesiology

## 2017-06-29 ENCOUNTER — Inpatient Hospital Stay
Admission: RE | Admit: 2017-06-29 | Discharge: 2017-07-01 | DRG: 470 | Disposition: A | Discharge status: Home / Self Care | Payer: Medicare Other | Source: Ambulatory Visit | Attending: Orthopedic Surgery | Admitting: Orthopedic Surgery

## 2017-06-29 ENCOUNTER — Other Ambulatory Visit: Payer: Self-pay

## 2017-06-29 ENCOUNTER — Encounter
Admission: RE | Disposition: A | Discharge status: Home / Self Care | Payer: Self-pay | Source: Ambulatory Visit | Attending: Orthopedic Surgery

## 2017-06-29 DIAGNOSIS — M1712 Unilateral primary osteoarthritis, left knee: Principal | ICD-10-CM | POA: Diagnosis present

## 2017-06-29 DIAGNOSIS — R51 Headache: Secondary | ICD-10-CM | POA: Diagnosis present

## 2017-06-29 DIAGNOSIS — M25562 Pain in left knee: Secondary | ICD-10-CM | POA: Diagnosis present

## 2017-06-29 DIAGNOSIS — Z96652 Presence of left artificial knee joint: Secondary | ICD-10-CM

## 2017-06-29 DIAGNOSIS — F17210 Nicotine dependence, cigarettes, uncomplicated: Secondary | ICD-10-CM | POA: Diagnosis present

## 2017-06-29 DIAGNOSIS — G8918 Other acute postprocedural pain: Secondary | ICD-10-CM

## 2017-06-29 HISTORY — PX: TOTAL KNEE ARTHROPLASTY: SHX125

## 2017-06-29 LAB — CBC
HEMATOCRIT: 39 % — AB (ref 40.0–52.0)
Hemoglobin: 13.1 g/dL (ref 13.0–18.0)
MCH: 30.4 pg (ref 26.0–34.0)
MCHC: 33.6 g/dL (ref 32.0–36.0)
MCV: 90.3 fL (ref 80.0–100.0)
PLATELETS: 160 10*3/uL (ref 150–440)
RBC: 4.32 MIL/uL — ABNORMAL LOW (ref 4.40–5.90)
RDW: 13.5 % (ref 11.5–14.5)
WBC: 6.6 10*3/uL (ref 3.8–10.6)

## 2017-06-29 LAB — CREATININE, SERUM
Creatinine, Ser: 1.14 mg/dL (ref 0.61–1.24)
GFR calc non Af Amer: >60 mL/min (ref >60)

## 2017-06-29 LAB — ABO/RH: ABO/RH(D): O POS

## 2017-06-29 SURGERY — ARTHROPLASTY, KNEE, TOTAL
Anesthesia: Spinal | Site: Knee | Laterality: Left | Wound class: Clean

## 2017-06-29 MED ORDER — PROPOFOL 10 MG/ML IV BOLUS
INTRAVENOUS | Status: DC | PRN
  Administered 2017-06-29: 20 mg via INTRAVENOUS

## 2017-06-29 MED ORDER — EPHEDRINE SULFATE 50 MG/ML IJ SOLN
INTRAMUSCULAR | Status: AC
  Filled 2017-06-29: qty 1

## 2017-06-29 MED ORDER — KETOROLAC TROMETHAMINE 30 MG/ML IJ SOLN
INTRAMUSCULAR | Status: AC
Start: 2017-06-29 — End: 2017-06-29
  Filled 2017-06-29: qty 1

## 2017-06-29 MED ORDER — BUPIVACAINE-EPINEPHRINE (PF) 0.25% -1:200000 IJ SOLN
INTRAMUSCULAR | Status: AC
  Filled 2017-06-29: qty 30

## 2017-06-29 MED ORDER — ACETAMINOPHEN 325 MG PO TABS: 325.0000 mg | ORAL_TABLET | Freq: Four times a day (QID) | ORAL | Status: DC | PRN

## 2017-06-29 MED ORDER — LACTATED RINGERS IV SOLN
INTRAVENOUS | Status: DC
Start: 2017-06-29 — End: 2017-06-29
  Administered 2017-06-29 (×2): via INTRAVENOUS

## 2017-06-29 MED ORDER — SODIUM CHLORIDE 0.9 % IV SOLN
INTRAVENOUS | Status: DC
  Administered 2017-06-29: 13:00:00 via INTRAVENOUS

## 2017-06-29 MED ORDER — BUPIVACAINE HCL (PF) 0.5 % IJ SOLN
INTRAMUSCULAR | Status: AC
  Filled 2017-06-29: qty 10

## 2017-06-29 MED ORDER — BUPIVACAINE-EPINEPHRINE (PF) 0.25% -1:200000 IJ SOLN
INTRAMUSCULAR | Status: DC | PRN
  Administered 2017-06-29: 30 mL

## 2017-06-29 MED ORDER — BUPIVACAINE LIPOSOME 1.3 % IJ SUSP
INTRAMUSCULAR | Status: AC
Start: 2017-06-29 — End: 2017-06-29
  Filled 2017-06-29: qty 20

## 2017-06-29 MED ORDER — ONDANSETRON HCL 4 MG/2ML IJ SOLN
4.0000 mg | Freq: Four times a day (QID) | INTRAMUSCULAR | Status: DC | PRN
Start: 2017-06-29 — End: 2017-07-01

## 2017-06-29 MED ORDER — BUPIVACAINE HCL (PF) 0.25 % IJ SOLN
INTRAMUSCULAR | Status: AC
  Filled 2017-06-29: qty 30

## 2017-06-29 MED ORDER — MIDAZOLAM HCL 2 MG/2ML IJ SOLN
INTRAMUSCULAR | Status: AC
  Filled 2017-06-29: qty 2

## 2017-06-29 MED ORDER — ONDANSETRON HCL 4 MG PO TABS: 4.0000 mg | ORAL_TABLET | Freq: Four times a day (QID) | ORAL | Status: DC | PRN

## 2017-06-29 MED ORDER — METOCLOPRAMIDE HCL 5 MG/ML IJ SOLN: 5.0000 mg | Freq: Three times a day (TID) | INTRAMUSCULAR | Status: DC | PRN

## 2017-06-29 MED ORDER — MENTHOL 3 MG MT LOZG
1.0000 | LOZENGE | OROMUCOSAL | Status: DC | PRN
  Filled 2017-06-29: qty 9

## 2017-06-29 MED ORDER — LIDOCAINE HCL (PF) 2 % IJ SOLN
INTRAMUSCULAR | Status: DC | PRN
  Administered 2017-06-29: 50 mg

## 2017-06-29 MED ORDER — PHENOL 1.4 % MT LIQD
1.0000 | OROMUCOSAL | Status: DC | PRN
  Filled 2017-06-29: qty 177

## 2017-06-29 MED ORDER — ALUM & MAG HYDROXIDE-SIMETH 200-200-20 MG/5ML PO SUSP
30.0000 mL | ORAL | Status: DC | PRN
  Administered 2017-06-30: 30 mL via ORAL
  Filled 2017-06-29: qty 30

## 2017-06-29 MED ORDER — FENTANYL CITRATE (PF) 100 MCG/2ML IJ SOLN
INTRAMUSCULAR | Status: AC
  Filled 2017-06-29: qty 2

## 2017-06-29 MED ORDER — BUPIVACAINE-EPINEPHRINE (PF) 0.5% -1:200000 IJ SOLN
INTRAMUSCULAR | Status: AC
  Filled 2017-06-29: qty 30

## 2017-06-29 MED ORDER — NEOMYCIN-POLYMYXIN B GU 40-200000 IR SOLN
Status: AC
  Filled 2017-06-29: qty 4

## 2017-06-29 MED ORDER — FENTANYL CITRATE (PF) 100 MCG/2ML IJ SOLN
INTRAMUSCULAR | Status: DC | PRN
  Administered 2017-06-29 (×2): 50 ug via INTRAVENOUS

## 2017-06-29 MED ORDER — SODIUM CHLORIDE 0.9 % IV SOLN
INTRAVENOUS | Status: DC | PRN
  Administered 2017-06-29: 60 mL

## 2017-06-29 MED ORDER — METOCLOPRAMIDE HCL 10 MG PO TABS: 5.0000 mg | ORAL_TABLET | Freq: Three times a day (TID) | ORAL | Status: DC | PRN

## 2017-06-29 MED ORDER — FENTANYL CITRATE (PF) 100 MCG/2ML IJ SOLN: 25.0000 ug | INTRAMUSCULAR | Status: DC | PRN

## 2017-06-29 MED ORDER — GLYCOPYRROLATE 0.2 MG/ML IJ SOLN
INTRAMUSCULAR | Status: AC
  Filled 2017-06-29: qty 1

## 2017-06-29 MED ORDER — ZOLPIDEM TARTRATE 5 MG PO TABS: 5.0000 mg | ORAL_TABLET | Freq: Every evening | ORAL | Status: DC | PRN

## 2017-06-29 MED ORDER — OXYCODONE HCL 5 MG PO TABS
5.0000 mg | ORAL_TABLET | ORAL | Status: DC | PRN
  Administered 2017-06-29 – 2017-06-30 (×4): 10 mg via ORAL
  Administered 2017-07-01: 5 mg via ORAL
  Filled 2017-06-29 (×3): qty 2
  Filled 2017-06-29: qty 1

## 2017-06-29 MED ORDER — MAGNESIUM HYDROXIDE 400 MG/5ML PO SUSP
30.0000 mL | Freq: Every day | ORAL | Status: DC | PRN
  Administered 2017-06-30: 30 mL via ORAL
  Filled 2017-06-29: qty 30

## 2017-06-29 MED ORDER — MORPHINE SULFATE 10 MG/ML IJ SOLN
INTRAMUSCULAR | Status: DC | PRN
  Administered 2017-06-29: 10 mg

## 2017-06-29 MED ORDER — BUPIVACAINE HCL (PF) 0.5 % IJ SOLN
INTRAMUSCULAR | Status: DC | PRN
  Administered 2017-06-29: 3 mL via INTRATHECAL

## 2017-06-29 MED ORDER — CEFAZOLIN SODIUM-DEXTROSE 2-4 GM/100ML-% IV SOLN
2.0000 g | Freq: Four times a day (QID) | INTRAVENOUS | Status: AC
  Administered 2017-06-29 – 2017-06-30 (×3): 2 g via INTRAVENOUS
  Filled 2017-06-29 (×3): qty 100

## 2017-06-29 MED ORDER — KETOROLAC TROMETHAMINE 30 MG/ML IJ SOLN
INTRAMUSCULAR | Status: DC | PRN
  Administered 2017-06-29: 30 mg

## 2017-06-29 MED ORDER — LIDOCAINE HCL (PF) 2 % IJ SOLN
INTRAMUSCULAR | Status: AC
  Filled 2017-06-29: qty 10

## 2017-06-29 MED ORDER — EPINEPHRINE PF 1 MG/ML IJ SOLN
INTRAMUSCULAR | Status: AC
  Filled 2017-06-29: qty 1

## 2017-06-29 MED ORDER — NEOMYCIN-POLYMYXIN B GU 40-200000 IR SOLN
Status: DC | PRN
  Administered 2017-06-29: 16 mL

## 2017-06-29 MED ORDER — MIDAZOLAM HCL 5 MG/5ML IJ SOLN
INTRAMUSCULAR | Status: DC | PRN
  Administered 2017-06-29 (×2): 2 mg via INTRAVENOUS

## 2017-06-29 MED ORDER — FAMOTIDINE 20 MG PO TABS
20.0000 mg | ORAL_TABLET | Freq: Once | ORAL | Status: AC
  Administered 2017-06-29: 20 mg via ORAL

## 2017-06-29 MED ORDER — PROPOFOL 500 MG/50ML IV EMUL
INTRAVENOUS | Status: DC | PRN
  Administered 2017-06-29: 50 ug/kg/min via INTRAVENOUS

## 2017-06-29 MED ORDER — PROPOFOL 500 MG/50ML IV EMUL
INTRAVENOUS | Status: AC
  Filled 2017-06-29: qty 50

## 2017-06-29 MED ORDER — BISACODYL 10 MG RE SUPP
10.0000 mg | Freq: Every day | RECTAL | Status: DC | PRN
  Administered 2017-06-30: 10 mg via RECTAL
  Filled 2017-06-29: qty 1

## 2017-06-29 MED ORDER — GLYCOPYRROLATE 0.2 MG/ML IJ SOLN
INTRAMUSCULAR | Status: DC | PRN
  Administered 2017-06-29: 0.2 mg via INTRAVENOUS

## 2017-06-29 MED ORDER — SODIUM CHLORIDE 0.9 % IJ SOLN
INTRAMUSCULAR | Status: AC
  Filled 2017-06-29: qty 50

## 2017-06-29 MED ORDER — METHOCARBAMOL 1000 MG/10ML IJ SOLN
500.0000 mg | Freq: Four times a day (QID) | INTRAMUSCULAR | Status: DC | PRN
  Filled 2017-06-29: qty 5

## 2017-06-29 MED ORDER — FAMOTIDINE 20 MG PO TABS
ORAL_TABLET | ORAL | Status: AC
  Administered 2017-06-29: 20 mg via ORAL
  Filled 2017-06-29: qty 1

## 2017-06-29 MED ORDER — SODIUM CHLORIDE 0.9 % IJ SOLN
INTRAMUSCULAR | Status: AC
Start: 2017-06-29 — End: 2017-06-29
  Filled 2017-06-29: qty 50

## 2017-06-29 MED ORDER — CEFAZOLIN SODIUM-DEXTROSE 2-4 GM/100ML-% IV SOLN
INTRAVENOUS | Status: AC
  Filled 2017-06-29: qty 100

## 2017-06-29 MED ORDER — MORPHINE SULFATE (PF) 10 MG/ML IV SOLN
INTRAVENOUS | Status: AC
  Filled 2017-06-29: qty 1

## 2017-06-29 MED ORDER — PHENYLEPHRINE HCL 10 MG/ML IJ SOLN
INTRAMUSCULAR | Status: AC
  Filled 2017-06-29: qty 1

## 2017-06-29 MED ORDER — DIPHENHYDRAMINE HCL 12.5 MG/5ML PO ELIX: 12.5000 mg | ORAL_SOLUTION | ORAL | Status: DC | PRN

## 2017-06-29 MED ORDER — OXYCODONE HCL 5 MG PO TABS
10.0000 mg | ORAL_TABLET | ORAL | Status: DC | PRN
  Administered 2017-07-01: 15 mg via ORAL
  Filled 2017-06-29: qty 2
  Filled 2017-06-29: qty 3

## 2017-06-29 MED ORDER — ENOXAPARIN SODIUM 30 MG/0.3ML ~~LOC~~ SOLN
30.0000 mg | Freq: Two times a day (BID) | SUBCUTANEOUS | Status: DC
  Administered 2017-06-30 – 2017-07-01 (×3): 30 mg via SUBCUTANEOUS
  Filled 2017-06-29 (×3): qty 0.3

## 2017-06-29 MED ORDER — HYDROMORPHONE HCL 1 MG/ML IJ SOLN: 0.5000 mg | INTRAMUSCULAR | Status: DC | PRN

## 2017-06-29 MED ORDER — METHOCARBAMOL 500 MG PO TABS
500.0000 mg | ORAL_TABLET | Freq: Four times a day (QID) | ORAL | Status: DC | PRN
  Administered 2017-06-30 (×2): 500 mg via ORAL
  Filled 2017-06-29 (×2): qty 1

## 2017-06-29 MED ORDER — GABAPENTIN 300 MG PO CAPS
300.0000 mg | ORAL_CAPSULE | Freq: Three times a day (TID) | ORAL | Status: DC
  Administered 2017-06-29 – 2017-07-01 (×4): 300 mg via ORAL
  Filled 2017-06-29 (×4): qty 1

## 2017-06-29 MED ORDER — DOCUSATE SODIUM 100 MG PO CAPS
100.0000 mg | ORAL_CAPSULE | Freq: Two times a day (BID) | ORAL | Status: DC
  Administered 2017-06-29 – 2017-07-01 (×4): 100 mg via ORAL
  Filled 2017-06-29 (×4): qty 1

## 2017-06-29 SURGICAL SUPPLY — 67 items
BANDAGE ACE 6X5 VEL STRL LF (GAUZE/BANDAGES/DRESSINGS) ×2 IMPLANT
BLADE SAGITTAL WIDE XTHICK NO (BLADE) IMPLANT
BLADE SAW 1 (BLADE) IMPLANT
BLADE SAW SAG 25.4X90 (BLADE) ×2 IMPLANT
BLOCK CUTTING TIBIAL 4 MED (MISCELLANEOUS) ×2 IMPLANT
BLOCK CUTTING TIBIAL 5 LT (MISCELLANEOUS) ×2 IMPLANT
CANISTER SUCT 1200ML W/VALVE (MISCELLANEOUS) ×2 IMPLANT
CANISTER SUCT 3000ML PPV (MISCELLANEOUS) ×4 IMPLANT
CEMENT HV SMART SET (Cement) ×4 IMPLANT
CHLORAPREP W/TINT 26ML (MISCELLANEOUS) ×4 IMPLANT
COOLER POLAR GLACIER W/PUMP (MISCELLANEOUS) ×2 IMPLANT
CUFF TOURN 24 STER (MISCELLANEOUS) IMPLANT
CUFF TOURN 30 STER DUAL PORT (MISCELLANEOUS) IMPLANT
DRAPE SHEET LG 3/4 BI-LAMINATE (DRAPES) ×4 IMPLANT
ELECT CAUTERY BLADE 6.4 (BLADE) ×2 IMPLANT
ELECT REM PT RETURN 9FT ADLT (ELECTROSURGICAL) ×2
ELECTRODE REM PT RTRN 9FT ADLT (ELECTROSURGICAL) ×1 IMPLANT
FEM COMP 5 LT CEMENT 02120005L (Femur) ×2 IMPLANT
FEMUR BONE MODEL 4.9010 MEDACT (MISCELLANEOUS) ×2 IMPLANT
GAUZE PETRO XEROFOAM 1X8 (MISCELLANEOUS) ×2 IMPLANT
GAUZE SPONGE 4X4 12PLY STRL (GAUZE/BANDAGES/DRESSINGS) ×2 IMPLANT
GLOVE BIO SURGEON STRL SZ8 (GLOVE) ×2 IMPLANT
GLOVE BIOGEL PI IND STRL 9 (GLOVE) ×1 IMPLANT
GLOVE BIOGEL PI INDICATOR 9 (GLOVE) ×1
GLOVE INDICATOR 8.0 STRL GRN (GLOVE) ×2 IMPLANT
GLOVE SURG ORTHO 8.0 STRL STRW (GLOVE) ×2 IMPLANT
GLOVE SURG SYN 9.0  PF PI (GLOVE) ×1
GLOVE SURG SYN 9.0 PF PI (GLOVE) ×1 IMPLANT
GOWN SRG 2XL LVL 4 RGLN SLV (GOWNS) ×1 IMPLANT
GOWN STRL NON-REIN 2XL LVL4 (GOWNS) ×1
GOWN STRL REUS W/ TWL LRG LVL3 (GOWN DISPOSABLE) ×1 IMPLANT
GOWN STRL REUS W/ TWL XL LVL3 (GOWN DISPOSABLE) ×1 IMPLANT
GOWN STRL REUS W/TWL LRG LVL3 (GOWN DISPOSABLE) ×1
GOWN STRL REUS W/TWL XL LVL3 (GOWN DISPOSABLE) ×1
HOLDER FOLEY CATH W/STRAP (MISCELLANEOUS) ×2 IMPLANT
HOOD PEEL AWAY FLYTE STAYCOOL (MISCELLANEOUS) ×4 IMPLANT
IMMBOLIZER KNEE 19 BLUE UNIV (SOFTGOODS) ×2 IMPLANT
INSERT TIBIAL FIXED SZ5 LEFT (Insert) ×2 IMPLANT
KIT TURNOVER KIT A (KITS) ×2 IMPLANT
KNIFE SCULPS 14X20 (INSTRUMENTS) ×2 IMPLANT
NDL SAFETY ECLIPSE 18X1.5 (NEEDLE) ×1 IMPLANT
NEEDLE HYPO 18GX1.5 SHARP (NEEDLE) ×1
NEEDLE SPNL 18GX3.5 QUINCKE PK (NEEDLE) ×2 IMPLANT
NEEDLE SPNL 20GX3.5 QUINCKE YW (NEEDLE) ×2 IMPLANT
NS IRRIG 1000ML POUR BTL (IV SOLUTION) ×2 IMPLANT
PACK TOTAL KNEE (MISCELLANEOUS) ×2 IMPLANT
PAD WRAPON POLAR KNEE (MISCELLANEOUS) ×1 IMPLANT
PATELLA RESURFACING MEDACTA SZ (Bone Implant) ×2 IMPLANT
PULSAVAC PLUS IRRIG FAN TIP (DISPOSABLE) ×2
SOL .9 NS 3000ML IRR  AL (IV SOLUTION) ×1
SOL .9 NS 3000ML IRR UROMATIC (IV SOLUTION) ×1 IMPLANT
STAPLER SKIN PROX 35W (STAPLE) ×2 IMPLANT
STEM EXTENSION 11MMX30MM (Stem) ×2 IMPLANT
SUCTION FRAZIER HANDLE 10FR (MISCELLANEOUS) ×1
SUCTION TUBE FRAZIER 10FR DISP (MISCELLANEOUS) ×1 IMPLANT
SUT DVC 2 QUILL PDO  T11 36X36 (SUTURE) ×1
SUT DVC 2 QUILL PDO T11 36X36 (SUTURE) ×1 IMPLANT
SUT V-LOC 90 ABS DVC 3-0 CL (SUTURE) ×2 IMPLANT
SYR 20CC LL (SYRINGE) ×2 IMPLANT
SYR 50ML LL SCALE MARK (SYRINGE) ×4 IMPLANT
TIB TRAY FIXED SZ5 L (Joint) ×2 IMPLANT
TIBIAL BONE MODEL LEFT (MISCELLANEOUS) ×2 IMPLANT
TIP FAN IRRIG PULSAVAC PLUS (DISPOSABLE) ×1 IMPLANT
TOWEL OR 17X26 4PK STRL BLUE (TOWEL DISPOSABLE) ×2 IMPLANT
TOWER CARTRIDGE SMART MIX (DISPOSABLE) ×2 IMPLANT
TRAY FOLEY W/METER SILVER 16FR (SET/KITS/TRAYS/PACK) ×2 IMPLANT
WRAPON POLAR PAD KNEE (MISCELLANEOUS) ×2

## 2017-06-29 NOTE — Anesthesia Procedure Notes (Signed)
Spinal  Patient location during procedure: OR Staffing Anesthesiologist: Piscitello, Joseph K, MD Resident/CRNA: Janna Oak, CRNA Performed: resident/CRNA  Preanesthetic Checklist Completed: patient identified, site marked, surgical consent, pre-op evaluation, timeout performed, IV checked, risks and benefits discussed and monitors and equipment checked Spinal Block Patient position: sitting Prep: ChloraPrep and site prepped and draped Patient monitoring: heart rate, continuous pulse ox, blood pressure and cardiac monitor Approach: midline Location: L4-5 Injection technique: single-shot Needle Needle type: Introducer and Pencan  Needle gauge: 24 G Needle length: 9 cm Additional Notes Negative paresthesia. Negative blood return. Positive free-flowing CSF. Expiration date of kit checked and confirmed. Patient tolerated procedure well, without complications.       

## 2017-06-29 NOTE — Anesthesia Post-op Follow-up Note (Signed)
Anesthesia QCDR form completed.        

## 2017-06-29 NOTE — Anesthesia Preprocedure Evaluation (Signed)
Anesthesia Evaluation  Patient identified by MRN, date of birth, ID band Patient awake    Reviewed: Allergy & Precautions, H&P , NPO status , Patient's Chart, lab work & pertinent test results  History of Anesthesia Complications Negative for: history of anesthetic complications  Airway Mallampati: III  TM Distance: >3 FB Neck ROM: full    Dental  (+) Chipped, Poor Dentition   Pulmonary neg pulmonary ROS, neg shortness of breath, COPD, Current Smoker,           Cardiovascular Exercise Tolerance: Good (-) angina(-) Past MI and (-) DOE negative cardio ROS       Neuro/Psych  Headaches, negative psych ROS   GI/Hepatic negative GI ROS, Neg liver ROS, neg GERD  ,  Endo/Other  negative endocrine ROS  Renal/GU      Musculoskeletal   Abdominal   Peds  Hematology negative hematology ROS (+)   Anesthesia Other Findings Past Medical History: No date: Headache  Past Surgical History: No date: APPENDECTOMY No date: FINGER SURGERY; Left     Comment:  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 11/28/2015: UMBILICAL HERNIA REPAIR; N/A     Comment:  Procedure: HERNIA REPAIR UMBILICAL ADULT;  Surgeon:               Nadeen LandauJarvis Wilton Smith, MD;  Location: [facility] ORS;  Service:               General;  Laterality: N/A; 01/09/2016: VENTRAL HERNIA REPAIR; N/A     Comment:  Procedure: HERNIA REPAIR VENTRAL ADULT;  Surgeon: Nadeen LandauJarvis              Wilton Smith, MD;  Location: [facility] ORS;  Service: General;              Laterality: N/A;     Reproductive/Obstetrics negative OB ROS                             Anesthesia Physical Anesthesia Plan  ASA: III  Anesthesia Plan: Spinal   Post-op Pain Management:    Induction:   PONV Risk Score and Plan:   Airway Management Planned: Natural Airway and Nasal Cannula  Additional Equipment:   Intra-op Plan:   Post-operative Plan:   Informed Consent: I have  reviewed the patients History and Physical, chart, labs and discussed the procedure including the risks, benefits and alternatives for the proposed anesthesia with the patient or authorized representative who has indicated his/her understanding and acceptance.   Dental Advisory Given  Plan Discussed with: Anesthesiologist, CRNA and Surgeon  Anesthesia Plan Comments: (Patient reports no bleeding problems and no anticoagulant use.  Plan for spinal with backup GA  Patient consented for risks of anesthesia including but not limited to:  - adverse reactions to medications - risk of bleeding, infection, nerve damage and headache - risk of failed spinal - damage to teeth, lips or other oral mucosa - sore throat or hoarseness - Damage to heart, brain, lungs or loss of life  Patient voiced understanding.)        Anesthesia Quick Evaluation

## 2017-06-29 NOTE — Op Note (Signed)
06/29/2017  9:41 AM  PATIENT:  Shawn Mckay  67 y.o. male  PRE-OPERATIVE DIAGNOSIS:  PRIMARY LOCALIZED OSTEOARTHRITIS OF LEFT KNEE  POST-OPERATIVE DIAGNOSIS:  PRIMARY LOCALIZED OSTEOARTHRITIS OF LEFT KNEE  PROCEDURE:  Procedure(s): TOTAL KNEE ARTHROPLASTY (Left)  SURGEON: Leitha SchullerMichael J Kao Conry, MD  ASSISTANTS: Cranston Neighborhris Gaines PA-C  ANESTHESIA:   spinal  EBL:  Total I/O In: 1300 [I.V.:1300] Out: 650 [Urine:500; Blood:150]  BLOOD ADMINISTERED:none  DRAINS: none   LOCAL MEDICATIONS USED:  MARCAINE    and OTHER Exparel morphine and Toradol  SPECIMEN:  No Specimen  DISPOSITION OF SPECIMEN:  N/A  COUNTS:  YES  TOURNIQUET:  * Missing tourniquet times found for documented tourniquets in log: [id number] *  IMPLANTS: Medacta GMK sphere left 5 femur, left 5 tibia with short stem and 3 patella, all components cemented  DICTATION: .Dragon Dictation   patient brought the operating room and after adequate spinal anesthesia was obtained left leg was prepped and draped in sterile fashion. After patient identification and timeout procedures were completed tourniquet was raised and midline skin incision was made followed by medial parapatellar arthrotomy. The knee revealed extensive medialcompartment degenerative changes with somesynovitis. The fat pad and anterior cruciate ligament and PCL were excised and the proximal tibia cutting guide was applied to the proximal tibia and proximal tibia cut carried out followed by distal femur cut. The 4-in-1 cutting guide was applied to the femur anterior posterior and chamfer cuts made. The posterior horns of the menisci could be resected this time and the size 4tibia baseplate trial was placed in apporpriote rotationand proximal reaming carried out for short stem. The keel punch was placed followed by the 275femoral trial.  The knee was tight in flexion and extension and the distal femur and proximal tibia recut following this the trials were replaced and the  10mm insert gave good stability through range of motion. This was thenchosen as the final implant. Distal femur drill holes were made followed by the trochlear groove cut and then trials were removed. The patella was affected with arthritis as welland resection was carried out drilling plate carried out and then a size 3patella fit well. These trials were all removed  Thelocal anesthetic noted above was infiltrated in the para-articular tissue. The bony surfaces thoroughly irrigated and dried. Tibial component was cemented in place first followed by the plastic insert and then the femoral component and the knee placed in extension with excess cement being removed. Next the patellar button was clamped into place and after the cement entirely set the clamp was removed the patella tracked well with the tourniquet down the knee was thoroughly irrigated and then closed with a heavy Quill with 3-0 locking suture followed by skin staples. Xeroform 4 x 4's web roll ABDs Polar Care and Ace wrap applied    PLAN OF CARE: Admit to inpatient   PATIENT DISPOSITION:  PACU - hemodynamically stable.

## 2017-06-29 NOTE — H&P (Signed)
Reviewed paper H+P, will be scanned into chart. No changes noted.  

## 2017-06-29 NOTE — Transfer of Care (Signed)
Immediate Anesthesia Transfer of Care Note  Patient: Shawn Mckay  Procedure(s) Performed: TOTAL KNEE ARTHROPLASTY (Left Knee)  Patient Location: PACU  Anesthesia Type:Spinal  Level of Consciousness: awake and alert   Airway & Oxygen Therapy: Patient Spontanous Breathing and Patient connected to face mask oxygen  Post-op Assessment: Report given to RN and Post -op Vital signs reviewed and stable  Post vital signs: Reviewed  Last Vitals:  Vitals Value Taken Time  BP 104/64 06/29/2017  9:42 AM  Temp    Pulse 78 06/29/2017  9:42 AM  Resp 20 06/29/2017  9:42 AM  SpO2 97 % 06/29/2017  9:42 AM  Vitals shown include unvalidated device data.  Last Pain:  Vitals:   06/29/17 0624  TempSrc: Oral  PainSc: 0-No pain         Complications: No apparent anesthesia complications

## 2017-06-29 NOTE — Progress Notes (Signed)
Patient refused pain medication and gabapentin. Education given about pain medication.

## 2017-06-29 NOTE — NC FL2 (Signed)
[place] MEDICAID FL2 LEVEL OF CARE SCREENING TOOL     IDENTIFICATION  Patient Name: Shawn Mckay Birthdate: 01/05/1951 Sex: male Admission Date (Current Location): 06/29/2017  [place]   Facility and Address:  [facility], [address], [place], [place] [address]      Provider Number: [id number]  Attending Physician Name and Address:  Kennedy BuckerMenz, Michael, MD  Relative Name and Phone Number:       Current Level of Care: Hospital Recommended Level of Care: Skilled Nursing Facility Prior Approval Number:    Date Approved/Denied:   PASRR Number: ([id number] [id number])  Discharge Plan: SNF    Current Diagnoses: Patient Active Problem List   Diagnosis Date Noted  . Status post total left knee replacement using cement 06/29/2017    Orientation RESPIRATION BLADDER Height & Weight     Self, Time, Situation, Place  Normal Continent Weight: 220 lb (99.8 kg) Height:  5\' 10"  (177.8 cm)  BEHAVIORAL SYMPTOMS/MOOD NEUROLOGICAL BOWEL NUTRITION STATUS      Continent    AMBULATORY STATUS COMMUNICATION OF NEEDS Skin   Extensive Assist Verbally Surgical wounds(Incision Left Knee)                       Personal Care Assistance Level of Assistance  Bathing, Feeding, Dressing Bathing Assistance: Limited assistance Feeding assistance: Independent Dressing Assistance: Limited assistance     Functional Limitations Info  Sight, Speech, Hearing Sight Info: Adequate Hearing Info: Adequate Speech Info: Adequate    SPECIAL CARE FACTORS FREQUENCY  PT (By licensed PT), OT (By licensed OT)     PT Frequency: (5) OT Frequency: (5)            Contractures      Additional Factors Info  Code Status, Allergies Code Status Info: (Full Code) Allergies Info: (BEE VENOM, CODEINE )           Current Medications (06/29/2017):  This is the current hospital active medication list Current Facility-Administered Medications  Medication  Dose Route Frequency Provider Last Rate Last Dose  . 0.9 %  sodium chloride infusion   Intravenous Continuous Kennedy BuckerMenz, Michael, MD 100 mL/hr at 06/29/17 1253    . [START ON 06/30/2017] acetaminophen (TYLENOL) tablet 325-650 mg  325-650 mg Oral Q6H PRN Kennedy BuckerMenz, Michael, MD      . alum & mag hydroxide-simeth (MAALOX/MYLANTA) 200-200-20 MG/5ML suspension 30 mL  30 mL Oral Q4H PRN Kennedy BuckerMenz, Michael, MD      . bisacodyl (DULCOLAX) suppository 10 mg  10 mg Rectal Daily PRN Kennedy BuckerMenz, Michael, MD      . ceFAZolin (ANCEF) IVPB 2g/100 mL premix  2 g Intravenous Q6H Kennedy BuckerMenz, Michael, MD 200 mL/hr at 06/29/17 1253 2 g at 06/29/17 1253  . diphenhydrAMINE (BENADRYL) 12.5 MG/5ML elixir 12.5-25 mg  12.5-25 mg Oral Q4H PRN Kennedy BuckerMenz, Michael, MD      . docusate sodium (COLACE) capsule 100 mg  100 mg Oral BID Kennedy BuckerMenz, Michael, MD      . Melene Muller[START ON 06/30/2017] enoxaparin (LOVENOX) injection 30 mg  30 mg Subcutaneous Q12H Kennedy BuckerMenz, Michael, MD      . gabapentin (NEURONTIN) capsule 300 mg  300 mg Oral TID Kennedy BuckerMenz, Michael, MD      . HYDROmorphone (DILAUDID) injection 0.5-1 mg  0.5-1 mg Intravenous Q4H PRN Kennedy BuckerMenz, Michael, MD      . magnesium hydroxide (MILK OF MAGNESIA) suspension 30 mL  30 mL Oral Daily PRN Kennedy BuckerMenz, Michael, MD      .  menthol-cetylpyridinium (CEPACOL) lozenge 3 mg  1 lozenge Oral PRN Kennedy Bucker, MD       Or  . phenol (CHLORASEPTIC) mouth spray 1 spray  1 spray Mouth/Throat PRN Kennedy Bucker, MD      . methocarbamol (ROBAXIN) tablet 500 mg  500 mg Oral Q6H PRN Kennedy Bucker, MD       Or  . methocarbamol (ROBAXIN) 500 mg in dextrose 5 % 50 mL IVPB  500 mg Intravenous Q6H PRN Kennedy Bucker, MD      . metoCLOPramide (REGLAN) tablet 5-10 mg  5-10 mg Oral Q8H PRN Kennedy Bucker, MD       Or  . metoCLOPramide (REGLAN) injection 5-10 mg  5-10 mg Intravenous Q8H PRN Kennedy Bucker, MD      . ondansetron [facility]) tablet 4 mg  4 mg Oral Q6H PRN Kennedy Bucker, MD       Or  . ondansetron [facility]) injection 4 mg  4 mg Intravenous Q6H PRN Kennedy Bucker, MD      . oxyCODONE (Oxy IR/ROXICODONE) immediate release tablet 10-15 mg  10-15 mg Oral Q4H PRN Kennedy Bucker, MD      . oxyCODONE (Oxy IR/ROXICODONE) immediate release tablet 5-10 mg  5-10 mg Oral Q4H PRN Kennedy Bucker, MD      . zolpidem (AMBIEN) tablet 5 mg  5 mg Oral QHS PRN Kennedy Bucker, MD         Discharge Medications: Please see discharge summary for a list of discharge medications.  Relevant Imaging Results:  Relevant Lab Results:   Additional Information (SSN: [id number])  Payton Spark, Student-Social Work

## 2017-06-29 NOTE — Progress Notes (Signed)
Notified Dr. Rosita KeaMenz that when patient was up walking with PT it was noticed that he had foot drop. Per Dr. Rosita KeaMenz loosen the ace wrap, place a pillow behind the knee and DO NOT USE the bone foam. He will be down to see the patient.

## 2017-06-29 NOTE — Evaluation (Signed)
Physical Therapy Evaluation Patient Details Name: Shawn Mckay MRN: [id number] DOB: [birth year] Today's Date: 06/29/2017   History of Present Illness  67 y/o male s/p L TKA 3/26.    Clinical Impression  Pt did very well functionally with all aspects of POD0 exam, he easily got to sitting EOB and standing w/o assist and was able to ambulate confidently (despite L foot drop) with walker and good WBing tolerance and consistent speed.  He showed exceptional strength with L hip and knee and had AROM for 106* on operative LE.  Pt very motivated and eager to push himself, did need some precautionary cues not to overdo it.      Follow Up Recommendations Outpatient PT    Equipment Recommendations  (pt has a 4WW and feels he does not need FWW, PT agrees)    Recommendations for Other Services       Precautions / Restrictions Precautions Precautions: Fall;Knee Restrictions Weight Bearing Restrictions: Yes LLE Weight Bearing: Weight bearing as tolerated      Mobility  Bed Mobility Overal bed mobility: Independent                Transfers Overall transfer level: Independent Equipment used: Rolling walker (2 wheeled)             General transfer comment: Pt was able to rise and maintain balance w/o direct use of walker  Ambulation/Gait Ambulation/Gait assistance: Independent Ambulation Distance (Feet): 75 Feet Assistive device: Rolling walker (2 wheeled)       General Gait Details: Pt showed great motivation and confidence with ambulation despite L foot drop.  Able to lift knee, clear toes and maintain consistent forward walker motion with good WBing tolerance though the L.  Stairs            Wheelchair Mobility    Modified Rankin (Stroke Patients Only)       Balance Overall balance assessment: Modified Independent                                           Pertinent Vitals/Pain Pain Assessment: 0-10 Pain Score: 3  Pain Location: L  knee, pain increased t/o the PT exam and activity (as high as 7/10 after walking)    Home Living Family/patient expects to be discharged to:: Private residence Living Arrangements: Spouse/significant other;Children(pt is caregiver for all 3 in-home members of his family)     Home Access: Ramped entrance       Home Equipment: Environmental consultant - 4 wheels      Prior Function Level of Independence: Independent         Comments: Pt very active with hunting, fishing, caring for disabled family memebers     Hand Dominance        Extremity/Trunk Assessment   Upper Extremity Assessment Upper Extremity Assessment: Overall WFL for tasks assessed    Lower Extremity Assessment Lower Extremity Assessment: Overall WFL for tasks assessed(exceptional POD0 strength t/o L LE except for 0/5 ankle )       Communication   Communication: No difficulties  Cognition Arousal/Alertness: Awake/alert Behavior During Therapy: WFL for tasks assessed/performed Overall Cognitive Status: Within Functional Limits for tasks assessed  General Comments      Exercises Total Joint Exercises Ankle Circles/Pumps: PROM;5 reps Quad Sets: Strengthening;15 reps Gluteal Sets: Strengthening;15 reps Short Arc Quad: Strengthening;10 reps Heel Slides: AROM;10 reps Hip ABduction/ADduction: Strengthening;15 reps Straight Leg Raises: Strengthening;15 reps Knee Flexion: AAROM;10 reps Goniometric ROM: 0-110 (AROM to 106)   Assessment/Plan    PT Assessment Patient needs continued PT services  PT Problem List Decreased strength;Decreased range of motion;Decreased activity tolerance;Decreased balance;Decreased mobility;Decreased coordination;Decreased knowledge of use of DME;Decreased safety awareness;Pain       PT Treatment Interventions DME instruction;Functional mobility training;Gait training;Therapeutic activities;Therapeutic exercise;Balance  training;Neuromuscular re-education;Patient/family education;Stair training    PT Goals (Current goals can be found in the Care Plan section)  Acute Rehab PT Goals Patient Stated Goal: go home ASAP PT Goal Formulation: With patient Time For Goal Achievement: 07/13/17 Potential to Achieve Goals: Good    Frequency BID   Barriers to discharge        Co-evaluation               AM-PAC PT "6 Clicks" Daily Activity  Outcome Measure Difficulty turning over in bed (including adjusting bedclothes, sheets and blankets)?: None Difficulty moving from lying on back to sitting on the side of the bed? : None Difficulty sitting down on and standing up from a chair with arms (e.g., wheelchair, bedside commode, etc,.)?: None Help needed moving to and from a bed to chair (including a wheelchair)?: None Help needed walking in hospital room?: None Help needed climbing 3-5 steps with a railing? : None 6 Click Score: 24    End of Session Equipment Utilized During Treatment: Gait belt Activity Tolerance: Patient tolerated treatment well Patient left: with call bell/phone within reach;with family/visitor present;with nursing/sitter in room;in chair Nurse Communication: (suggested post-session pain meds, foot drop) PT Visit Diagnosis: Muscle weakness (generalized) (M62.81);Difficulty in walking, not elsewhere classified (R26.2)    Time: [id number] PT Time Calculation (min) (ACUTE ONLY): 26 min   Charges:   PT Evaluation $PT Eval Low Complexity: 1 Low PT Treatments $Therapeutic Exercise: 8-22 mins   PT G Codes:        Shawn Mckay, DPT 06/29/2017, 4:35 PM

## 2017-06-30 ENCOUNTER — Encounter: Payer: Self-pay | Admitting: Orthopedic Surgery

## 2017-06-30 LAB — GLUCOSE, CAPILLARY: Glucose-Capillary: 151 mg/dL — ABNORMAL HIGH (ref 65–99)

## 2017-06-30 MED ORDER — SODIUM CHLORIDE 0.9 % IV BOLUS
500.0000 mL | Freq: Once | INTRAVENOUS | Status: AC
  Administered 2017-06-30: 500 mL via INTRAVENOUS

## 2017-06-30 NOTE — Progress Notes (Signed)
Clinical Social Worker (CSW) received SNF consult. PT is recommending outpatient PT. RN case manager aware of above. Please reconsult if future social work needs arise. CSW signing off.   Rashid Whitenight, LCSW [phone]  

## 2017-06-30 NOTE — Anesthesia Postprocedure Evaluation (Signed)
Anesthesia Post Note  Patient: Shawn Mckay  Procedure(s) Performed: TOTAL KNEE ARTHROPLASTY (Left Knee)  Anesthesia Type: Spinal Level of consciousness: awake and awake and alert Pain management: pain level controlled Vital Signs Assessment: post-procedure vital signs reviewed and stable Respiratory status: spontaneous breathing Cardiovascular status: blood pressure returned to baseline Postop Assessment: no headache, no backache and no apparent nausea or vomiting Anesthetic complications: no     Last Vitals:  Vitals:   06/29/17 2349 06/30/17 0501  BP: 123/83 132/85  Pulse: 92 (!) 102  Resp: 17 18  Temp: 37.2 C 37.2 C  SpO2: 96% 93%    Last Pain:  Vitals:   06/30/17 0503  TempSrc:   PainSc: 3                  Leola Fiore Lawerance CruelStarr

## 2017-06-30 NOTE — Progress Notes (Signed)
Physical Therapy Treatment Patient Details Name: Shawn ArrowRichard J Picou MRN: [id number] DOB: 02/04/1951 Today's Date: 06/30/2017    History of Present Illness 67 y/o male s/p L TKA 3/26.      PT Comments    Pt agreeable to PT and anxious to get out of bed. Pt continues to have elevated HR (128 at rest and 135 with STS transfer). Pt also continues to demonstrate impulsive/unsafe mobility with abandonment of rolling walker with transfer/short ambulation bed to chair requiring increased education on proper/safe technique. Pt also requires increased education on L knee positioning for encouraging/progressing L knee extension. Pt noted to use several methods for maintaining slight flexion for comfort, but L knee extension is slightly worse than yesterday. Pt educated on purpose and benefit for gaining and maintaining L knee extension. Pt received up in chair comfortably with proper positioning. Continue PT to progress range, endurance and strength with safe HR levels to improve functional mobility and allow for an optimal, safe return home.    Follow Up Recommendations  Outpatient PT     Equipment Recommendations       Recommendations for Other Services       Precautions / Restrictions Precautions Precautions: Fall;Knee Precaution Comments: Pt impulsive and does not follow instructions Restrictions Weight Bearing Restrictions: Yes LLE Weight Bearing: Weight bearing as tolerated    Mobility  Bed Mobility Overal bed mobility: Independent             General bed mobility comments: Moves quickly and abruptly  Transfers Overall transfer level: Needs assistance   Transfers: Sit to/from Stand Sit to Stand: Min guard(due to impulsivity)         General transfer comment: deferred due to resting HR of  124 to 126  Ambulation/Gait Ambulation/Gait assistance: Min guard Ambulation Distance (Feet): 4 Feet Assistive device: Rolling walker (2 wheeled)       General Gait Details: Pt stands  with rw; however, after 1 step abandones rw and haphazardly steps around unsafely reaching for chair to finish walking to chair. Education provided again   Information systems managertairs            Wheelchair Mobility    Modified Rankin (Stroke Patients Only)       Balance                                            Cognition Arousal/Alertness: Awake/alert Behavior During Therapy: Impulsive Overall Cognitive Status: Within Functional Limits for tasks assessed                                        Exercises Total Joint Exercises Ankle Circles/Pumps: AROM;Both;20 reps;Seated Quad Sets: Strengthening;Both;20 reps;Supine;Other (comment)(improving. Increased time education) Long Arc Quad: Barbaraann BoysAAROM;Left;10 reps;Seated;Other (comment)(2 sets) Knee Flexion: AROM;AAROM;Left;10 reps;Seated(3-4 positions each rep with hold; pt self assists) Goniometric ROM: 5-112 Other Exercises Other Exercises: Increased education provided on proper position of LLE/knee for improved knee extension and affect on ambulation. Pt noted to use several methods for keeping L knee slightly flexed for comfort.     General Comments        Pertinent Vitals/Pain Pain Assessment: 0-10 Pain Score: 5  Pain Location: L knee Pain Descriptors / Indicators: Constant;Grimacing;Moaning;Tightness;Other (Comment)(pt does not follow instructions for avoiding add'l pain) Pain Intervention(s): Monitored during session;Premedicated before  session;Ice applied    Home Living                      Prior Function            PT Goals (current goals can now be found in the care plan section) Progress towards PT goals: Progressing toward goals    Frequency    BID      PT Plan Current plan remains appropriate    Co-evaluation              AM-PAC PT "6 Clicks" Daily Activity  Outcome Measure  Difficulty turning over in bed (including adjusting bedclothes, sheets and blankets)?:  None Difficulty moving from lying on back to sitting on the side of the bed? : None Difficulty sitting down on and standing up from a chair with arms (e.g., wheelchair, bedside commode, etc,.)?: A Little Help needed moving to and from a bed to chair (including a wheelchair)?: A Little Help needed walking in hospital room?: A Little Help needed climbing 3-5 steps with a railing? : A Little 6 Click Score: 20    End of Session Equipment Utilized During Treatment: Gait belt Activity Tolerance: Other (comment)(by HR) Patient left: in bed;with call bell/phone within reach;with bed alarm set;Other (comment)(polar care in place) Nurse Communication: Other (comment)(HR; inpulsivity) PT Visit Diagnosis: Muscle weakness (generalized) (M62.81);Difficulty in walking, not elsewhere classified (R26.2)     Time: 1312-1400 PT Time Calculation (min) (ACUTE ONLY): 48 min  Charges:  $Gait Training: 8-22 mins $Therapeutic Exercise: 23-37 mins                    G Codes:        Scot Dock, PTA 06/30/2017, 3:06 PM

## 2017-06-30 NOTE — Progress Notes (Signed)
Physical Therapy Treatment Patient Details Name: Shawn Mckay MRN: [id number] DOB: 1950/12/29 Today's Date: 06/30/2017    History of Present Illness 67 y/o male s/p L TKA 3/26.      PT Comments    Spoke with nurse prior to session who notes pt having episode of low BP/syncopal episode in the bed earlier this morn; states BP currently is baseline. Pt has been pre medicated; pain in L knee currently 7/10. HR at rest noted to be 126 and remains 124-126 throughout session; therefore, stand/ambulation activities deferred. Pt noted to be quite impulsive/restless throughout session (also noted by nurse in conversation). Educated pt on stretching and exercises for L knee emphasizing managing pain (keeping moderate level with stretch to avoid over inflammation etc.) Pt does not follow even with repeated education/explanation and cueing. Pt demonstrates stretching to the point of using profanity; pt also gets very anxious and aggravated over difficulty with performing a quad set on L. Does not follow instruction well (own agenda). Pt notes fatigue post pain medication and encouraged to rest. Continue PT to progress strength, endurance and safety with exercises. Attempt to progress mobility as vital signs allow.   Follow Up Recommendations  Outpatient PT     Equipment Recommendations       Recommendations for Other Services       Precautions / Restrictions Precautions Precautions: Fall;Knee Precaution Comments: Pt impulsive and does not follow instructions Restrictions Weight Bearing Restrictions: Yes LLE Weight Bearing: Weight bearing as tolerated    Mobility  Bed Mobility Overal bed mobility: Independent             General bed mobility comments: Moves quickly and abruptly  Transfers                 General transfer comment: deferred due to resting HR of  124 to 126  Ambulation/Gait                 Stairs            Wheelchair Mobility    Modified Rankin  (Stroke Patients Only)       Balance                                            Cognition Arousal/Alertness: Awake/alert Behavior During Therapy: Impulsive Overall Cognitive Status: Within Functional Limits for tasks assessed                                        Exercises Total Joint Exercises Ankle Circles/Pumps: AROM;Both;20 reps;Seated Quad Sets: Strengthening;Both;20 reps;Supine;Other (comment)(poor on L) Long Arc Quad: AAROM;Left;10 reps;Seated;Other (comment)(2 sets) Knee Flexion: AROM;AAROM;Left;10 reps;Seated(3-4 positions each rep with hold; pt self assists) Goniometric ROM: 5-112    General Comments        Pertinent Vitals/Pain Pain Assessment: 0-10 Pain Score: 7  Pain Location: L knee Pain Descriptors / Indicators: Constant;Grimacing;Moaning;Tightness;Other (Comment)(pt does not follow instructions for avoiding add'l pain) Pain Intervention(s): Monitored during session;Premedicated before session;Ice applied    Home Living                      Prior Function            PT Goals (current goals can now be found in the care plan section)  Progress towards PT goals: PT to reassess next treatment    Frequency    BID      PT Plan Current plan remains appropriate    Co-evaluation              AM-PAC PT "6 Clicks" Daily Activity  Outcome Measure  Difficulty turning over in bed (including adjusting bedclothes, sheets and blankets)?: None Difficulty moving from lying on back to sitting on the side of the bed? : None Difficulty sitting down on and standing up from a chair with arms (e.g., wheelchair, bedside commode, etc,.)?: A Little Help needed moving to and from a bed to chair (including a wheelchair)?: A Little Help needed walking in hospital room?: A Little Help needed climbing 3-5 steps with a railing? : A Little 6 Click Score: 20    End of Session   Activity Tolerance: Other (comment)(by  HR) Patient left: in bed;with call bell/phone within reach;with bed alarm set;Other (comment)(polar care in place) Nurse Communication: Other (comment)(HR; inpulsivity) PT Visit Diagnosis: Muscle weakness (generalized) (M62.81);Difficulty in walking, not elsewhere classified (R26.2)     Time: [id number] PT Time Calculation (min) (ACUTE ONLY): 29 min  Charges:  $Therapeutic Exercise: 23-37 mins                    G Codes:        Scot DockHeidi E Shakima Nisley, PTA 06/30/2017, 12:22 PM

## 2017-06-30 NOTE — Plan of Care (Signed)
  Problem: Education: Goal: Knowledge of General Education information will improve Outcome: Progressing   Problem: Health Behavior/Discharge Planning: Goal: Ability to manage health-related needs will improve Outcome: Progressing   Problem: Clinical Measurements: Goal: Ability to maintain clinical measurements within normal limits will improve Outcome: Progressing Goal: Will remain free from infection Outcome: Progressing Goal: Diagnostic test results will improve Outcome: Progressing Goal: Respiratory complications will improve Outcome: Progressing Goal: Cardiovascular complication will be avoided Outcome: Progressing   Problem: Activity: Goal: Risk for activity intolerance will decrease Outcome: Progressing   Problem: Nutrition: Goal: Adequate nutrition will be maintained Outcome: Progressing   

## 2017-06-30 NOTE — Progress Notes (Signed)
   Subjective: 1 Day Post-Op Procedure(s) (LRB): TOTAL KNEE ARTHROPLASTY (Left) Patient reports pain as 10 on 0-10 scale.   Patient is complaining of severe pain but not taking much medication to help alleviate the pain.  He is trying to work on knee range of motion on his own. Denies any CP, SOB, ABD pain. We will start physical therapy today.  Plan is to go Home after hospital stay.  Objective: Vital signs in last 24 hours: Temp:  [97.4 F (36.3 C)-98.9 F (37.2 C)] 98.9 F (37.2 C) (03/27 0501) Pulse Rate:  [53-102] 102 (03/27 0501) Resp:  [17-26] 18 (03/27 0501) BP: (98-146)/(63-97) 132/85 (03/27 0501) SpO2:  [91 %-100 %] 93 % (03/27 0501) Weight:  [99.8 kg (220 lb)] 99.8 kg (220 lb) (03/26 1102)  Intake/Output from previous day: 03/26 0701 - 03/27 0700 In: 3391.7 [P.O.:480; I.V.:2811.7; IV Piggyback:100] Out: 650 [Urine:500; Blood:150] Intake/Output this shift: No intake/output data recorded.  Recent Labs    06/29/17 1124  HGB 13.1   Recent Labs    06/29/17 1124  WBC 6.6  RBC 4.32*  HCT 39.0*  PLT 160   Recent Labs    06/29/17 1124  CREATININE 1.14   No results for input(s): LABPT, INR in the last 72 hours.  EXAM General - Patient is Alert, Appropriate and Oriented Extremity - Neurovascular intact Sensation intact distally Intact pulses distally Dorsiflexion/Plantar flexion intact No cellulitis present Compartment soft Dressing - dressing C/D/I and no drainage Motor Function - intact, moving foot and toes well on exam.   Past Medical History:  Diagnosis Date  . Headache     Assessment/Plan:   1 Day Post-Op Procedure(s) (LRB): TOTAL KNEE ARTHROPLASTY (Left) Active Problems:   Status post total left knee replacement using cement  Estimated body mass index is 31.57 kg/m as calculated from the following:   Height as of this encounter: 5\' 10"  (1.778 m).   Weight as of this encounter: 99.8 kg (220 lb). Advance diet Up with therapy   Encouraged patient to take medications as needed and prescribed for pain.  Discussed with nurse.  Labs are stable.  Recheck labs in the morning Vital signs currently stable. Care management to assist with discharge     DVT Prophylaxis - Lovenox, Foot Pumps and TED hose Weight-Bearing as tolerated to  left leg   T. Cranston Neighborhris Gaines, PA-C [facility] 06/30/2017, 7:58 AM

## 2017-07-01 LAB — CBC
HEMATOCRIT: 33.6 % — AB (ref 40.0–52.0)
HEMOGLOBIN: 12.1 g/dL — AB (ref 13.0–18.0)
MCH: 31.5 pg (ref 26.0–34.0)
MCHC: 35.9 g/dL (ref 32.0–36.0)
MCV: 87.8 fL (ref 80.0–100.0)
Platelets: 133 10*3/uL — ABNORMAL LOW (ref 150–440)
RBC: 3.83 MIL/uL — ABNORMAL LOW (ref 4.40–5.90)
RDW: 13.2 % (ref 11.5–14.5)
WBC: 8 10*3/uL (ref 3.8–10.6)

## 2017-07-01 LAB — BASIC METABOLIC PANEL
Anion gap: 7 (ref 5–15)
BUN: 10 mg/dL (ref 6–20)
CHLORIDE: 107 mmol/L (ref 101–111)
CO2: 22 mmol/L (ref 22–32)
CREATININE: 1.05 mg/dL (ref 0.61–1.24)
Calcium: 8.2 mg/dL — ABNORMAL LOW (ref 8.9–10.3)
GFR calc Af Amer: >60 mL/min (ref >60)
GFR calc non Af Amer: >60 mL/min (ref >60)
GLUCOSE: 134 mg/dL — AB (ref 65–99)
Potassium: 4 mmol/L (ref 3.5–5.1)
Sodium: 136 mmol/L (ref 135–145)

## 2017-07-01 MED ORDER — METHOCARBAMOL 500 MG PO TABS: 500.0000 mg | ORAL_TABLET | Freq: Four times a day (QID) | ORAL | 0 refills | Status: AC | PRN

## 2017-07-01 MED ORDER — OXYCODONE HCL 5 MG PO TABS: 5.0000 mg | ORAL_TABLET | ORAL | 0 refills | Status: AC | PRN

## 2017-07-01 MED ORDER — ENOXAPARIN SODIUM 40 MG/0.4ML ~~LOC~~ SOLN: 40.0000 mg | SUBCUTANEOUS | 0 refills | Status: AC

## 2017-07-01 MED ORDER — DOCUSATE SODIUM 100 MG PO CAPS: 100.0000 mg | ORAL_CAPSULE | Freq: Two times a day (BID) | ORAL | 0 refills | Status: AC

## 2017-07-01 MED ORDER — ACETAMINOPHEN 500 MG PO TABS: 500.0000 mg | ORAL_TABLET | Freq: Four times a day (QID) | ORAL | Status: AC | PRN

## 2017-07-01 NOTE — Progress Notes (Signed)
Physical Therapy Treatment Patient Details Name: Shawn Mckay MRN: [id number] DOB: May 12, 1950 Today's Date: 07/01/2017    History of Present Illness 67 y/o male s/p L TKA 3/26.      PT Comments    Pt is able to participate with more functional activities today with HR being more appropriate (generally 90s, increased to 120 with prolonged ambulation).  Pt has been avoiding pain meds (did take some before session today) and is limited with what he can tolerate due to this.  He is still very motivated and functionally able to do what he needs, but knee ROM was stiffer than is has been (4-85 with a lot of pain at both end ranges) and he did not show as good quality of quad engagement as on POD0 (last time this PT saw him).  He was very reliant on the walker and UEs during ambulation but was able to walk ~250 ft w/o needing direct assist.  Pt educated on the need to be able to tolerate PT to get the full benefit of PT and that TKE/quad engagement is very important as a foundation for the rest of his recovery - pt eager to get home knows he will need to take pain meds to be able to participate fully with PT.    Follow Up Recommendations  Outpatient PT     Equipment Recommendations  Rolling walker with 5" wheels, pt needing the stability afforded by FWW (as opposed to the 4WW at home)    Recommendations for Other Services       Precautions / Restrictions Precautions Precautions: Fall;Knee Restrictions Weight Bearing Restrictions: Yes LLE Weight Bearing: Weight bearing as tolerated    Mobility  Bed Mobility Overal bed mobility: Independent             General bed mobility comments: able to get to sitting w/o assist, no issues apart from pain  Transfers Overall transfer level: Modified independent Equipment used: Rolling walker (2 wheeled) Transfers: Sit to/from Stand Sit to Stand: Supervision         General transfer comment: Pt able to rise to standing with heavy UE use,  hesitant with L  Ambulation/Gait Ambulation/Gait assistance: Min guard Ambulation Distance (Feet): 250 Feet Assistive device: Rolling walker (2 wheeled)       General Gait Details: Pt unable to achieve TKE on L as well as having considerable pain.  He was able to tough it out and circumambulate the nurses' station but was very reliant on the walker and had multiple bouts of near buckling on the L.  Pt did not progress as expected over the last 2 days regarding confidence with ambulation and will not be safe with the 4WW he was confident he would use at home (needs FWW)   Stairs Stairs: Yes   Stair Management: Two rails;Step to pattern Number of Stairs: 4 General stair comments: Pt able to negotiate up/down steps w/o issue, heavily reliant on UEs for stability  Wheelchair Mobility    Modified Rankin (Stroke Patients Only)       Balance Overall balance assessment: Modified Independent                                          Cognition Arousal/Alertness: Awake/alert Behavior During Therapy: Impulsive Overall Cognitive Status: Within Functional Limits for tasks assessed  Exercises Total Joint Exercises Ankle Circles/Pumps: Strengthening;10 reps Quad Sets: Strengthening;15 reps Gluteal Sets: Strengthening;15 reps Short Arc Quad: AAROM;AROM;10 reps Heel Slides: AROM;10 reps;Strengthening Hip ABduction/ADduction: Strengthening;15 reps Straight Leg Raises: AROM;10 reps Knee Flexion: PROM;5 reps Goniometric ROM: 4-85(pt more swollen and very pain limited today)    General Comments        Pertinent Vitals/Pain Pain Assessment: 0-10 Pain Score: 10-Worst pain ever Pain Location: L knee    Home Living                      Prior Function            PT Goals (current goals can now be found in the care plan section) Progress towards PT goals: Progressing toward goals    Frequency     BID      PT Plan Current plan remains appropriate    Co-evaluation              AM-PAC PT "6 Clicks" Daily Activity  Outcome Measure  Difficulty turning over in bed (including adjusting bedclothes, sheets and blankets)?: None Difficulty moving from lying on back to sitting on the side of the bed? : None Difficulty sitting down on and standing up from a chair with arms (e.g., wheelchair, bedside commode, etc,.)?: A Little Help needed moving to and from a bed to chair (including a wheelchair)?: None Help needed walking in hospital room?: None Help needed climbing 3-5 steps with a railing? : None 6 Click Score: 23    End of Session Equipment Utilized During Treatment: Gait belt Activity Tolerance: Patient limited by pain Patient left: with bed alarm set;with call bell/phone within reach;with family/visitor present Nurse Communication: (pain med organization) PT Visit Diagnosis: Muscle weakness (generalized) (M62.81);Difficulty in walking, not elsewhere classified (R26.2)     Time: [id number] PT Time Calculation (min) (ACUTE ONLY): 30 min  Charges:  $Gait Training: 8-22 mins $Therapeutic Exercise: 8-22 mins                    G Codes:       Malachi ProGalen R Tamara Monteith, DPT 07/01/2017, 11:24 AM

## 2017-07-01 NOTE — Care Management Note (Signed)
Case Management Note  Patient Details  Name: Shawn Mckay MRN: [id number] Date of Birth: 1950/06/16  Subjective/Objective: Met with patient and Rachelle Hora, PA at bedside to discuss discharge planning. Discuss home health verses outpatient PT. Patient prefers outpatient PT at [facility] clinic. Rachelle Hora states he will set this up at the office for patient. Patient has a walker. Pharmacy: [facility] : [place]  [phone]. Called Lovenox 40 ng # 14 no refills.                  Action/Plan: Discharging today. Outpatient PT at [facility] Lovenox called in.   Expected Discharge Date:                  Expected Discharge Plan:  OP Rehab  In-House Referral:     Discharge planning Services  CM Consult  Post Acute Care Choice:    Choice offered to:  Patient  DME Arranged:    DME Agency:     HH Arranged:    [place] Agency:     Status of Service:  Completed, signed off  If discussed at [employer] of Stay Meetings, dates discussed:    Additional Comments:  Jolly Mango, RN 07/01/2017, 8:51 AM

## 2017-07-01 NOTE — Progress Notes (Signed)
Discharge summary reviewed with verbal understanding. 3 Rxs given upon discharge. Reinforcement of Lovenox with teach back. Polar care instruction given. Escorted to personal vehicle via wc by ortho staff. Patient to p/u walker across the street

## 2017-07-01 NOTE — Discharge Summary (Signed)
Physician Discharge Summary  Patient ID: Shawn Mckay MRN: [id number] DOB/AGE: 1950-06-30 67 y.o.  Admit date: 06/29/2017 Discharge date: 07/01/2017  Admission Diagnoses:  PRIMARY LOCALIZED OSTEOARTHRITIS OF LEFT KNEE   Discharge Diagnoses: Patient Active Problem List   Diagnosis Date Noted  . Status post total left knee replacement using cement 06/29/2017    Past Medical History:  Diagnosis Date  . Headache      Transfusion: none   Consultants (if any):   Discharged Condition: Improved  Hospital Course: Shawn Mckay is an 67 y.o. male who was admitted 06/29/2017 with a diagnosis of left knee osteoatrthritis and went to the operating room on 06/29/2017 and underwent the above named procedures.    Surgeries: Procedure(s): TOTAL KNEE ARTHROPLASTY on 06/29/2017 Patient tolerated the surgery well. Taken to PACU where she was stabilized and then transferred to the orthopedic floor.  Started on Lovenox 30mg  q 12 hrs. Foot pumps applied bilaterally at 80 mm. Heels elevated on bed with rolled towels. No evidence of DVT. Negative Homan. Physical therapy started on day #1 for gait training and transfer. OT started day #1 for ADL and assisted devices.  Patient's foley was d/c on day #1. Patient's IV  was d/c on day #2.  On post op day #2 patient was stable and ready for discharge to home with outpatient PT.  Implants: Medacta GMK sphere left 5 femur, left 5 tibia with short stem and 3 patella, all components cemented    He was given perioperative antibiotics:  Anti-infectives (From admission, onward)   Start     Dose/Rate Route Frequency Ordered Stop   06/29/17 1300  ceFAZolin (ANCEF) IVPB 2g/100 mL premix     2 g 200 mL/hr over 30 Minutes Intravenous Every 6 hours 06/29/17 1056 06/30/17 0149   06/29/17 0610  ceFAZolin (ANCEF) 2-4 GM/100ML-% IVPB    Note to Pharmacy:  Mikey Bussing   : cabinet override      06/29/17 0610 06/29/17 0739   06/28/17 2245  ceFAZolin (ANCEF) IVPB  2g/100 mL premix     2 g 200 mL/hr over 30 Minutes Intravenous  Once 06/28/17 2232 06/29/17 0809    .  He was given sequential compression devices, early ambulation, and Lovenox for DVT prophylaxis.  He benefited maximally from the hospital stay and there were no complications.    Recent vital signs:  Vitals:   06/30/17 2350 07/01/17 0424  BP: 117/80 129/83  Pulse: 99 63  Resp: 18 18  Temp: 98.2 F (36.8 C) 97.9 F (36.6 C)  SpO2: 93% 96%    Recent laboratory studies:  Lab Results  Component Value Date   HGB 12.1 (L) 07/01/2017   HGB 13.1 06/29/2017   HGB 15.0 06/23/2017   Lab Results  Component Value Date   WBC 8.0 07/01/2017   PLT 133 (L) 07/01/2017   Lab Results  Component Value Date   INR 0.91 06/23/2017   Lab Results  Component Value Date   NA 136 07/01/2017   K 4.0 07/01/2017   CL 107 07/01/2017   CO2 22 07/01/2017   BUN 10 07/01/2017   CREATININE 1.05 07/01/2017   GLUCOSE 134 (H) 07/01/2017    Discharge Medications:   Allergies as of 07/01/2017      Reactions   Bee Venom Anaphylaxis   Codeine Other (See Comments)   "makes me crazy"      Medication List    STOP taking these medications   naproxen sodium 220 MG tablet  Commonly known as:  ALEVE     TAKE these medications   acetaminophen 500 MG tablet Commonly known as:  TYLENOL Take 1-2 tablets (500-1,000 mg total) by mouth every 6 (six) hours as needed for mild pain (pain score 1-3 or temp > 100.5).   docusate sodium 100 MG capsule Commonly known as:  COLACE Take 1 capsule (100 mg total) by mouth 2 (two) times daily.   enoxaparin 40 MG/0.4ML injection Commonly known as:  LOVENOX Inject 0.4 mLs (40 mg total) into the skin daily for 14 days.   methocarbamol 500 MG tablet Commonly known as:  ROBAXIN Take 1 tablet (500 mg total) by mouth every 6 (six) hours as needed for muscle spasms.   oxyCODONE 5 MG immediate release tablet Commonly known as:  Oxy IR/ROXICODONE Take 1-2 tablets  (5-10 mg total) by mouth every 4 (four) hours as needed for severe pain.            Durable Medical Equipment  (From admission, onward)        Start     Ordered   06/29/17 1057  DME Walker rolling  Once    Question:  Patient needs a walker to treat with the following condition  Answer:  Status post total left knee replacement using cement   06/29/17 1056   06/29/17 1057  DME 3 n 1  Once     06/29/17 1056   06/29/17 1057  DME Bedside commode  Once    Question:  Patient needs a bedside commode to treat with the following condition  Answer:  Status post total left knee replacement using cement   06/29/17 1056      Diagnostic Studies: Dg Knee 1-2 Views Left  Result Date: 06/29/2017 CLINICAL DATA:  Postop left knee replacement EXAM: LEFT KNEE - 1-2 VIEW COMPARISON:  None. FINDINGS: Two views study shows the patient to be status post tricompartmental knee replacement. No evidence for immediate hardware complications. Gas in the soft tissues and joint space is compatible with the immediate postoperative state. Skin staples are noted anteriorly. IMPRESSION: Status post tricompartmental knee replacement without evidence for immediate hardware complications. Electronically Signed   By: Kennith CenterEric  Mansell M.D.   On: 06/29/2017 10:35    Disposition:     Follow-up Information    Kennedy BuckerMenz, Michael, MD Follow up in 2 week(s).   Specialty:  Orthopedic Surgery Contact information: [address] [facility] [place] [address] [phone]            Signed: Patience MuscaGAINES,  CHRISTOPHER 07/01/2017, 8:58 AM

## 2017-07-01 NOTE — Discharge Instructions (Signed)

## 2017-07-01 NOTE — Progress Notes (Signed)
   Subjective: 2 Days Post-Op Procedure(s) (LRB): TOTAL KNEE ARTHROPLASTY (Left) Patient reports pain as 9 on 0-10 scale.   Pain has improved but still not taking pain medications as prescribed.  Tachycardic yesterday, denies any chest pain, shortness of breath, coughing.  Heart rate improved today.   Denies any CP, SOB, ABD pain. We will continue physical therapy today.  Plan is to go Home after hospital stay.  Objective: Vital signs in last 24 hours: Temp:  [97.8 F (36.6 C)-98.4 F (36.9 C)] 97.9 F (36.6 C) (03/28 0424) Pulse Rate:  [63-128] 63 (03/28 0424) Resp:  [17-18] 18 (03/28 0424) BP: (117-142)/(80-85) 129/83 (03/28 0424) SpO2:  [93 %-96 %] 96 % (03/28 0424)  Intake/Output from previous day: 03/27 0701 - 03/28 0700 In: 1000 [P.O.:1000] Out: 1600 [Urine:1600] Intake/Output this shift: No intake/output data recorded.  Recent Labs    06/29/17 1124 07/01/17 0423  HGB 13.1 12.1*   Recent Labs    06/29/17 1124 07/01/17 0423  WBC 6.6 8.0  RBC 4.32* 3.83*  HCT 39.0* 33.6*  PLT 160 133*   Recent Labs    06/29/17 1124 07/01/17 0423  NA  --  136  K  --  4.0  CL  --  107  CO2  --  22  BUN  --  10  CREATININE 1.14 1.05  GLUCOSE  --  134*  CALCIUM  --  8.2*   No results for input(s): LABPT, INR in the last 72 hours.  EXAM General - Patient is Alert, Appropriate and Oriented Extremity - Neurovascular intact Sensation intact distally Intact pulses distally Dorsiflexion/Plantar flexion intact No cellulitis present Compartment soft Dressing - dressing C/D/I and no drainage Motor Function - intact, moving foot and toes well on exam.   Past Medical History:  Diagnosis Date  . Headache     Assessment/Plan:   2 Days Post-Op Procedure(s) (LRB): TOTAL KNEE ARTHROPLASTY (Left) Active Problems:   Status post total left knee replacement using cement  Estimated body mass index is 31.57 kg/m as calculated from the following:   Height as of this  encounter: 5\' 10"  (1.778 m).   Weight as of this encounter: 99.8 kg (220 lb). Advance diet Up with therapy  Discharge home today with outpatient physical therapy pending progress with PT session today. Follow-up with [facility] in 2 weeks.    DVT Prophylaxis - Lovenox, Foot Pumps and TED hose Weight-Bearing as tolerated to  left leg   T. Cranston Neighborhris Gaines, PA-C [facility] 07/01/2017, 8:53 AM

## 2017-07-08 ENCOUNTER — Telehealth: Payer: Self-pay | Admitting: Licensed Clinical Social Worker

## 2017-07-08 NOTE — Telephone Encounter (Signed)
EMMI flagged patient for answering yes to loss of interest in things and yes to feeling sad/hopeless/anxouis/empty. Clinical Child psychotherapistocial Worker (CSW) attempted to contact patient via telephone however he did not answer and a voicemail was left at 4:04 pm encouraging patient to call CSW back.

## 2017-07-09 ENCOUNTER — Telehealth: Payer: Self-pay | Admitting: Licensed Clinical Social Worker

## 2017-07-09 NOTE — Telephone Encounter (Signed)
EMMI flagged patient for answering yes to loss of interest in things and yes to feeling sad/hopeless/anxouis/empty. Clinical Child psychotherapistocial Worker (CSW) attempted to contact patient via telephone for the second time however he did not answer and a voicemail was left at 6:02 pm encouraging patient to call CSW back. No other calls will be made.   Baker Hughes IncorporatedBailey Ashleen Demma, LCSW [phone]

## 2017-12-22 DIAGNOSIS — M7732 Calcaneal spur, left foot: Secondary | ICD-10-CM | POA: Diagnosis not present

## 2017-12-22 DIAGNOSIS — M79672 Pain in left foot: Secondary | ICD-10-CM | POA: Diagnosis not present

## 2017-12-22 DIAGNOSIS — M79671 Pain in right foot: Secondary | ICD-10-CM | POA: Diagnosis not present

## 2017-12-22 DIAGNOSIS — M779 Enthesopathy, unspecified: Secondary | ICD-10-CM | POA: Diagnosis not present

## 2017-12-22 DIAGNOSIS — M722 Plantar fascial fibromatosis: Secondary | ICD-10-CM | POA: Diagnosis not present

## 2018-01-19 DIAGNOSIS — M722 Plantar fascial fibromatosis: Secondary | ICD-10-CM | POA: Diagnosis not present

## 2018-01-19 DIAGNOSIS — M779 Enthesopathy, unspecified: Secondary | ICD-10-CM | POA: Diagnosis not present

## 2018-02-16 DIAGNOSIS — S90851D Superficial foreign body, right foot, subsequent encounter: Secondary | ICD-10-CM | POA: Diagnosis not present

## 2018-03-16 DIAGNOSIS — S90851D Superficial foreign body, right foot, subsequent encounter: Secondary | ICD-10-CM | POA: Diagnosis not present

## 2018-03-16 DIAGNOSIS — M79671 Pain in right foot: Secondary | ICD-10-CM | POA: Diagnosis not present

## 2018-04-11 DIAGNOSIS — R05 Cough: Secondary | ICD-10-CM | POA: Diagnosis not present

## 2018-04-11 DIAGNOSIS — J9801 Acute bronchospasm: Secondary | ICD-10-CM | POA: Diagnosis not present

## 2018-04-11 DIAGNOSIS — J209 Acute bronchitis, unspecified: Secondary | ICD-10-CM | POA: Diagnosis not present

## 2018-04-11 DIAGNOSIS — J019 Acute sinusitis, unspecified: Secondary | ICD-10-CM | POA: Diagnosis not present

## 2018-04-11 DIAGNOSIS — R509 Fever, unspecified: Secondary | ICD-10-CM | POA: Diagnosis not present

## 2018-04-11 DIAGNOSIS — B9689 Other specified bacterial agents as the cause of diseases classified elsewhere: Secondary | ICD-10-CM | POA: Diagnosis not present

## 2018-04-11 DIAGNOSIS — R079 Chest pain, unspecified: Secondary | ICD-10-CM | POA: Diagnosis not present

## 2018-05-09 DIAGNOSIS — H524 Presbyopia: Secondary | ICD-10-CM | POA: Diagnosis not present

## 2018-06-10 DIAGNOSIS — H6122 Impacted cerumen, left ear: Secondary | ICD-10-CM | POA: Diagnosis not present

## 2018-08-02 DIAGNOSIS — R7303 Prediabetes: Secondary | ICD-10-CM | POA: Diagnosis not present

## 2018-08-02 DIAGNOSIS — Z125 Encounter for screening for malignant neoplasm of prostate: Secondary | ICD-10-CM | POA: Diagnosis not present

## 2018-08-02 DIAGNOSIS — F17200 Nicotine dependence, unspecified, uncomplicated: Secondary | ICD-10-CM | POA: Diagnosis not present

## 2018-08-02 DIAGNOSIS — E781 Pure hyperglyceridemia: Secondary | ICD-10-CM | POA: Diagnosis not present

## 2018-08-05 ENCOUNTER — Other Ambulatory Visit: Payer: Self-pay | Admitting: Family Medicine

## 2018-08-05 DIAGNOSIS — E781 Pure hyperglyceridemia: Secondary | ICD-10-CM | POA: Diagnosis not present

## 2018-08-05 DIAGNOSIS — Z0000 Encounter for general adult medical examination without abnormal findings: Secondary | ICD-10-CM | POA: Diagnosis not present

## 2018-08-05 DIAGNOSIS — R7303 Prediabetes: Secondary | ICD-10-CM | POA: Diagnosis not present

## 2018-08-05 DIAGNOSIS — F17200 Nicotine dependence, unspecified, uncomplicated: Secondary | ICD-10-CM | POA: Diagnosis not present

## 2018-08-05 DIAGNOSIS — Z87891 Personal history of nicotine dependence: Secondary | ICD-10-CM

## 2018-08-05 DIAGNOSIS — Z23 Encounter for immunization: Secondary | ICD-10-CM | POA: Diagnosis not present

## 2018-08-09 DIAGNOSIS — H6062 Unspecified chronic otitis externa, left ear: Secondary | ICD-10-CM | POA: Diagnosis not present

## 2018-08-09 DIAGNOSIS — H6123 Impacted cerumen, bilateral: Secondary | ICD-10-CM | POA: Diagnosis not present

## 2019-02-01 DIAGNOSIS — M778 Other enthesopathies, not elsewhere classified: Secondary | ICD-10-CM | POA: Diagnosis not present

## 2019-02-02 ENCOUNTER — Ambulatory Visit: Payer: Medicare HMO

## 2019-02-17 DIAGNOSIS — E781 Pure hyperglyceridemia: Secondary | ICD-10-CM | POA: Diagnosis not present

## 2019-02-17 DIAGNOSIS — R7303 Prediabetes: Secondary | ICD-10-CM | POA: Diagnosis not present

## 2019-03-13 DIAGNOSIS — E785 Hyperlipidemia, unspecified: Secondary | ICD-10-CM | POA: Diagnosis not present

## 2019-03-13 DIAGNOSIS — F17200 Nicotine dependence, unspecified, uncomplicated: Secondary | ICD-10-CM | POA: Diagnosis not present

## 2019-03-13 DIAGNOSIS — E781 Pure hyperglyceridemia: Secondary | ICD-10-CM | POA: Diagnosis not present

## 2019-03-13 DIAGNOSIS — E1169 Type 2 diabetes mellitus with other specified complication: Secondary | ICD-10-CM | POA: Diagnosis not present

## 2019-07-12 DIAGNOSIS — E1169 Type 2 diabetes mellitus with other specified complication: Secondary | ICD-10-CM | POA: Diagnosis not present

## 2019-07-12 DIAGNOSIS — E781 Pure hyperglyceridemia: Secondary | ICD-10-CM | POA: Diagnosis not present

## 2019-07-12 DIAGNOSIS — E785 Hyperlipidemia, unspecified: Secondary | ICD-10-CM | POA: Diagnosis not present

## 2019-07-12 DIAGNOSIS — F17200 Nicotine dependence, unspecified, uncomplicated: Secondary | ICD-10-CM | POA: Diagnosis not present

## 2019-07-19 DIAGNOSIS — Z0000 Encounter for general adult medical examination without abnormal findings: Secondary | ICD-10-CM | POA: Diagnosis not present

## 2019-07-19 DIAGNOSIS — F17200 Nicotine dependence, unspecified, uncomplicated: Secondary | ICD-10-CM | POA: Diagnosis not present

## 2019-07-19 DIAGNOSIS — E785 Hyperlipidemia, unspecified: Secondary | ICD-10-CM | POA: Diagnosis not present

## 2019-07-19 DIAGNOSIS — E6609 Other obesity due to excess calories: Secondary | ICD-10-CM | POA: Diagnosis not present

## 2019-07-19 DIAGNOSIS — F332 Major depressive disorder, recurrent severe without psychotic features: Secondary | ICD-10-CM | POA: Diagnosis not present

## 2019-07-19 DIAGNOSIS — E1169 Type 2 diabetes mellitus with other specified complication: Secondary | ICD-10-CM | POA: Diagnosis not present

## 2019-07-19 DIAGNOSIS — Z6830 Body mass index (BMI) 30.0-30.9, adult: Secondary | ICD-10-CM | POA: Diagnosis not present

## 2019-09-21 IMAGING — CT CT KNEE*L* W/O CM
3 of 9 series · 10 of 33 positions shown, 11 images · non-contrast
Comparison: None.

CLINICAL DATA: Arthritis of the left knee. Left knee pain.
Preoperative planning.

EXAM:
CT OF THE LEFT KNEE WITHOUT CONTRAST
TECHNIQUE: Multidetector CT imaging of the left knee was performed according to
the MY KNEE protocol. Multiplanar CT image reconstructions were also
generated.

[Series 5: knee · sagittal · 0.29mm/px · 5 of 84 slices shown (1 of 3)]
[im 14/84  bone]
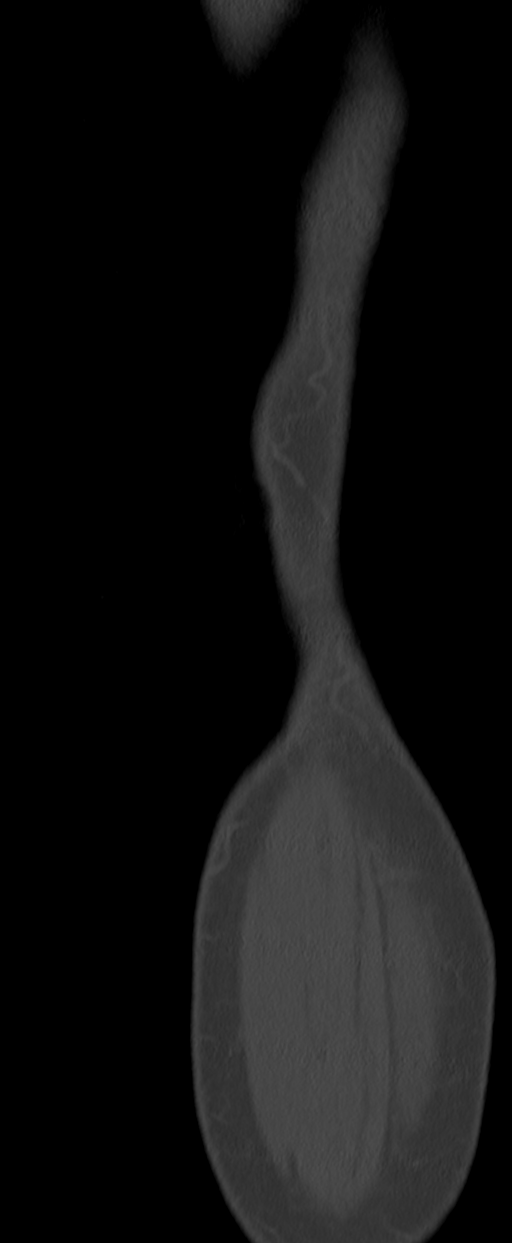
[im 28/84  bone]
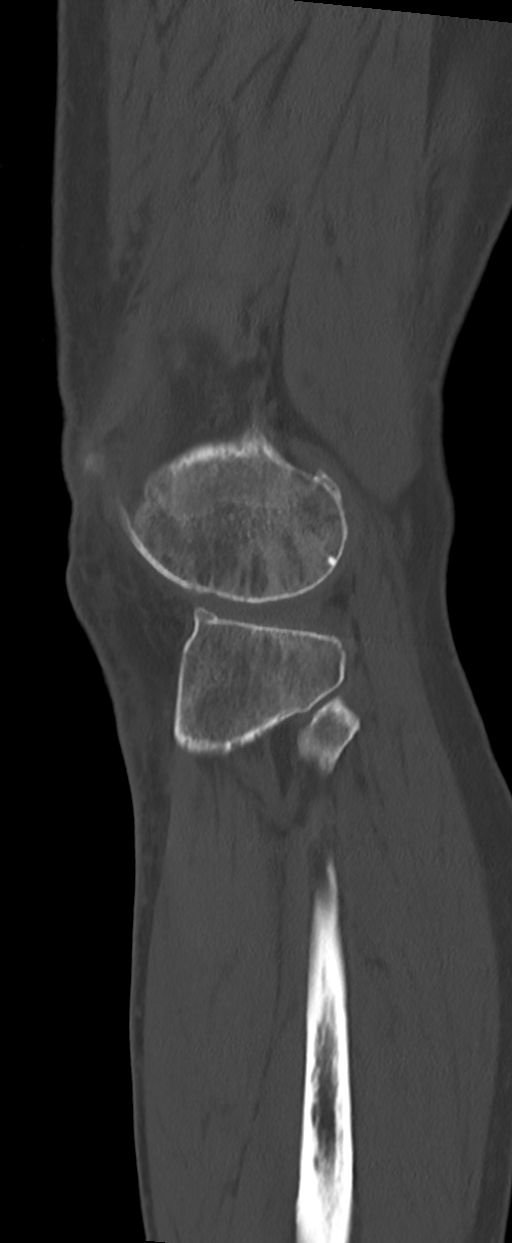
[im 42/84  bone]
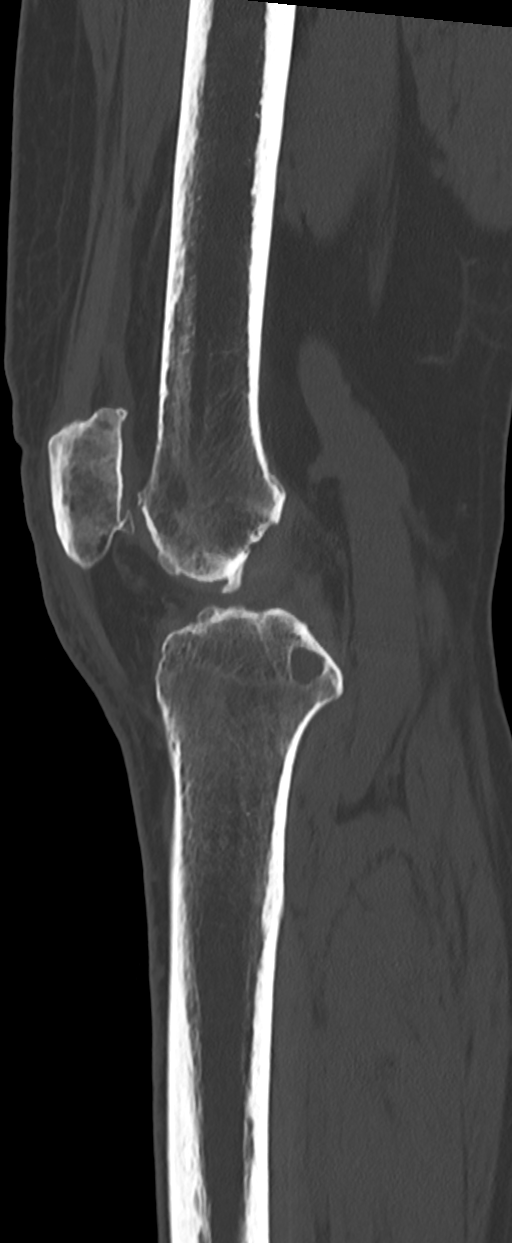
[im 56/84  bone]
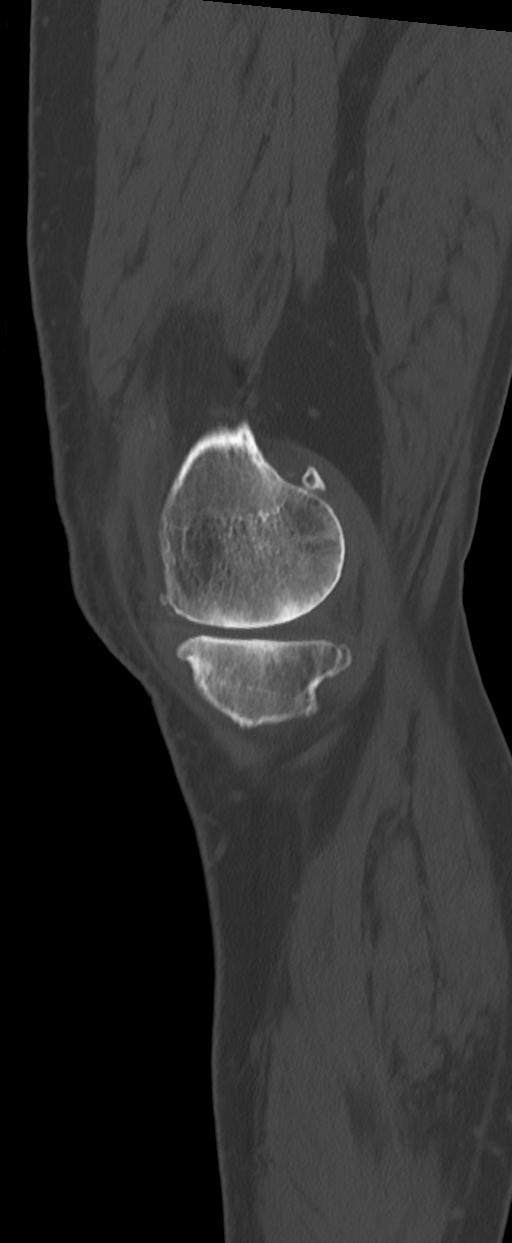
[im 70/84  bone]
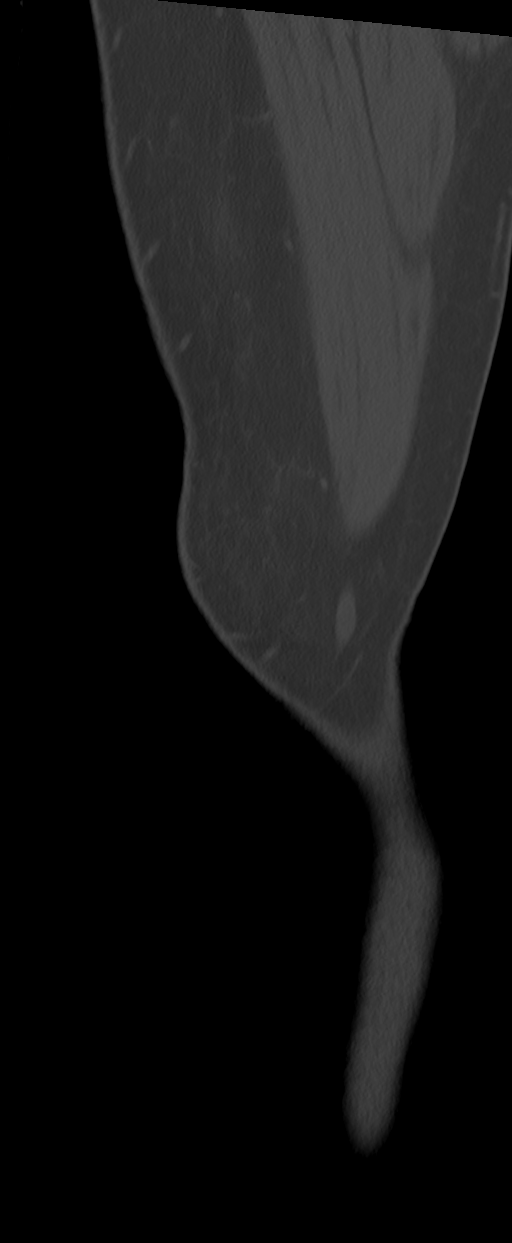

[Series 10: knee · axial · 0.39mm/px · z∈[+1023,+1241]mm · 4 of 606 slices shown, 5 images (2 of 3)]
[im 122/606  soft-tissue]
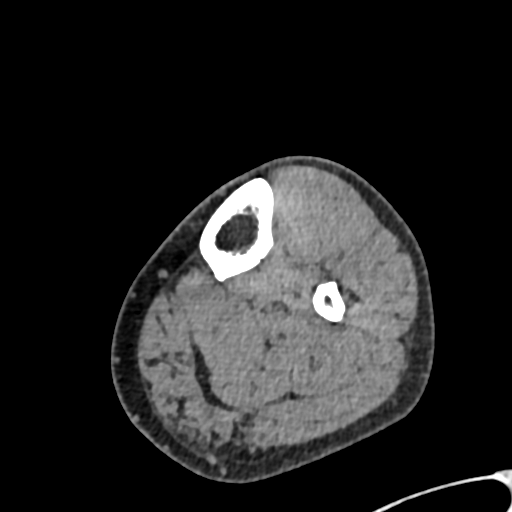
[im 122/606  bone]
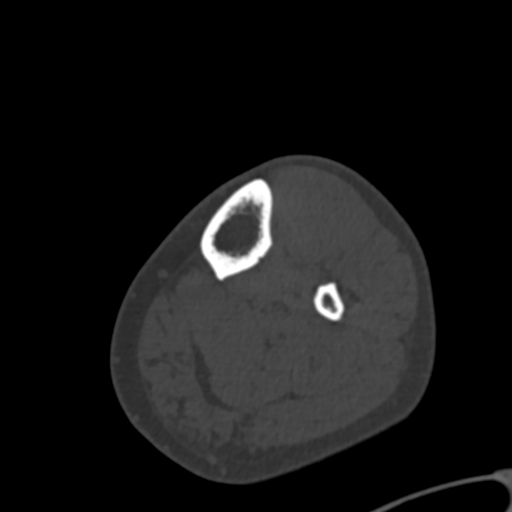
[im 243/606  bone]
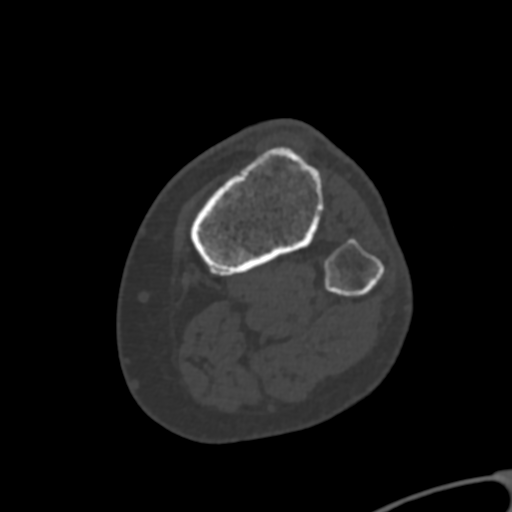
[im 364/606  bone]
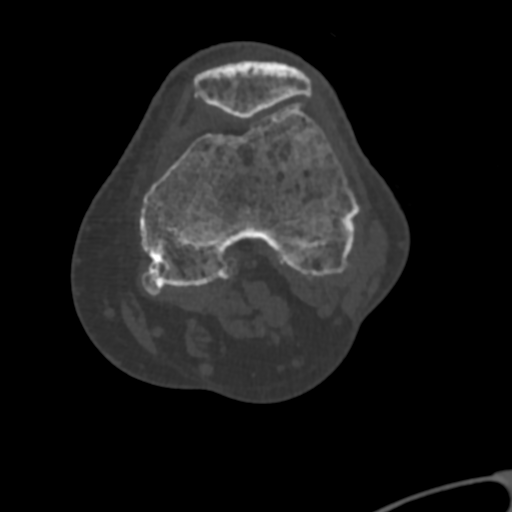
[im 485/606  bone]
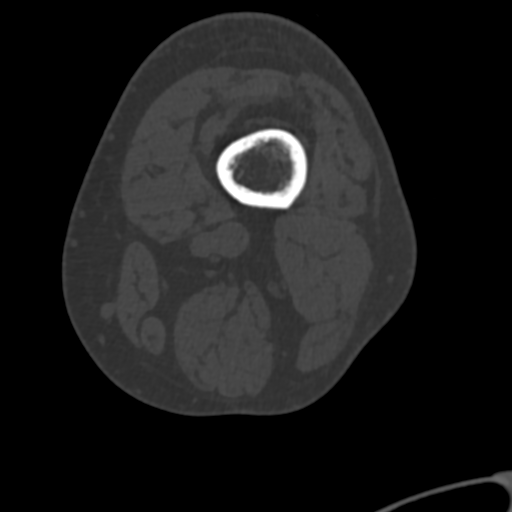

[Series 12: knee · coronal · 0.33mm/px · 1 of 75 slices shown (3 of 3)]
[im 38/75  bone]
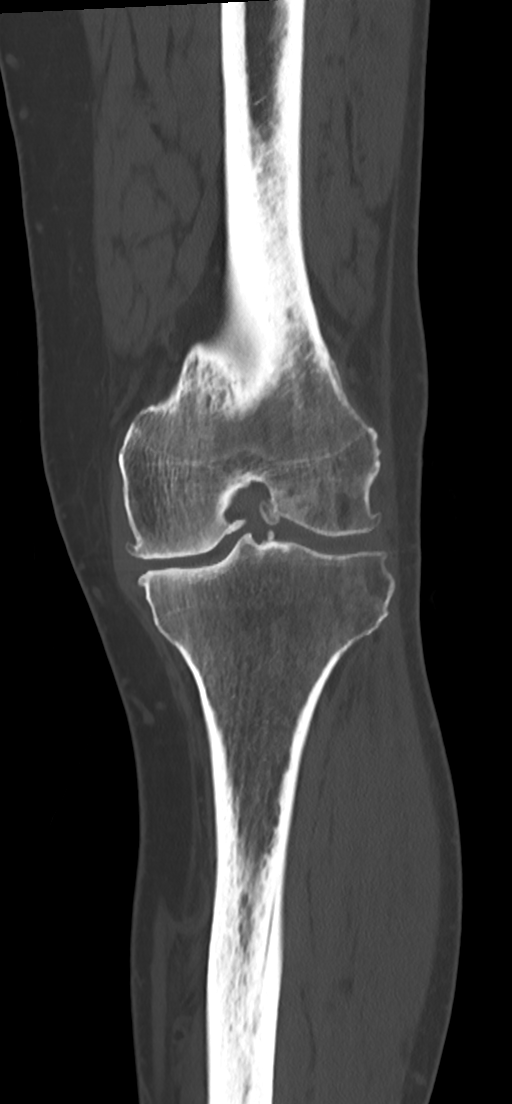

[10 of 33 positions shown; findings below may reference images not displayed]

FINDINGS: Bones/Joint/Cartilage

There is tricompartmental osteoarthritis with medial joint space
narrowing and tricompartmental marginal osteophytes. 17 mm
subcortical cyst in the posterior aspect of the proximal tibia
adjacent to the posterior cruciate ligament insertion. Small joint
effusion with loose bodies in the joint.

Minimal arthritic changes of the left hip. Left ankle appears
normal.

Ligaments

Suboptimally assessed by CT. Cruciate and collateral ligaments
appear to be intact.

Muscles and Tendons

Normal.

Soft tissues

Normal.
IMPRESSION: Tricompartmental osteoarthritis as described.

## 2021-05-28 DIAGNOSIS — Z0100 Encounter for examination of eyes and vision without abnormal findings: Secondary | ICD-10-CM | POA: Diagnosis not present

## 2021-05-28 DIAGNOSIS — H524 Presbyopia: Secondary | ICD-10-CM | POA: Diagnosis not present

## 2021-08-15 ENCOUNTER — Ambulatory Visit: Payer: Medicare HMO | Admitting: Podiatry

## 2021-08-15 DIAGNOSIS — L989 Disorder of the skin and subcutaneous tissue, unspecified: Secondary | ICD-10-CM

## 2021-08-15 NOTE — Progress Notes (Signed)
? ?  Subjective: ?71 y.o. male presenting to the office today as a new patient for evaluation of symptomatic calluses to the plantar aspect of the fifth MTP joint bilateral.  Patient states that he did see a podiatrist several years ago and they were debrided and he felt significant relief for about 1 year.  This slowly came back and he has been trying to trim them out himself.  He denies a history of injury.  He presents for further treatment and evaluation ? ? ?Past Medical History:  ?Diagnosis Date  ? Headache   ? ?Past Surgical History:  ?Procedure Laterality Date  ? APPENDECTOMY    ? FINGER SURGERY Left   ? 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 TOTAL KNEE ARTHROPLASTY Left 06/29/2017  ? Procedure: TOTAL KNEE ARTHROPLASTY;  Surgeon: Kennedy Bucker, MD;  Location: [facility] ORS;  Service: Orthopedics;  Laterality: Left;  ? UMBILICAL HERNIA REPAIR N/A 11/28/2015  ? Procedure: HERNIA REPAIR UMBILICAL ADULT;  Surgeon: Nadeen Landau, MD;  Location: [facility] ORS;  Service: General;  Laterality: N/A;  ? VENTRAL HERNIA REPAIR N/A 01/09/2016  ? Procedure: HERNIA REPAIR VENTRAL ADULT;  Surgeon: Nadeen Landau, MD;  Location: [facility] ORS;  Service: General;  Laterality: N/A;  ? ?Allergies  ?Allergen Reactions  ? Bee Venom Anaphylaxis  ? Codeine Other (See Comments)  ?  "makes me crazy"  ? ? ? ?Objective:  ?Physical Exam ?General: Alert and oriented x3 in no acute distress ? ?Dermatology: Hyperkeratotic lesion(s) present on the plantar aspect of the fifth MTP joint bilateral. Pain on palpation with a central nucleated core noted. Skin is warm, dry and supple bilateral lower extremities. Negative for open lesions or macerations. ? ?Vascular: Palpable pedal pulses bilaterally. No edema or erythema noted. Capillary refill within normal limits. ? ?Neurological: Epicritic and protective threshold grossly intact bilaterally.  ? ?Musculoskeletal Exam: Pain on palpation at the keratotic lesion(s) noted. Range of motion within  normal limits bilateral. Muscle strength 5/5 in all groups bilateral. ? ?Assessment: ?1.  Benign porokeratosis/IPK bilateral fifth MTP ? ? ?Plan of Care:  ?1. Patient evaluated ?2. Excisional debridement of keratoic lesion(s) using a chisel blade was performed without incident.  ?3. Dressed area with light dressing. ?4.  Recommend corn callus remover daily  ?5.  Advised against going barefoot  ?6.  Patient is to return to the clinic PRN.  ? ?Felecia Shelling, DPM ?[facility] ? ?Dr. Felecia Shelling, DPM  ?  ?[address]                                        ?[place], [place] [address]                ?Office [phone]  ?Fax [phone] ? ? ? ? ?

## 2024-02-23 DIAGNOSIS — J019 Acute sinusitis, unspecified: Secondary | ICD-10-CM | POA: Diagnosis not present

## 2024-02-23 DIAGNOSIS — B9689 Other specified bacterial agents as the cause of diseases classified elsewhere: Secondary | ICD-10-CM | POA: Diagnosis not present

## 2024-02-23 DIAGNOSIS — H6992 Unspecified Eustachian tube disorder, left ear: Secondary | ICD-10-CM | POA: Diagnosis not present

## 2024-02-23 DIAGNOSIS — J029 Acute pharyngitis, unspecified: Secondary | ICD-10-CM | POA: Diagnosis not present

## 2024-02-23 DIAGNOSIS — J40 Bronchitis, not specified as acute or chronic: Secondary | ICD-10-CM | POA: Diagnosis not present

## 2024-02-23 DIAGNOSIS — Z03818 Encounter for observation for suspected exposure to other biological agents ruled out: Secondary | ICD-10-CM | POA: Diagnosis not present
